# Patient Record
Sex: Female | Born: 1950
Health system: Southern US, Community
[De-identification: ages and names within clinical notes are randomized; demographics above are authoritative.]

## PROBLEM LIST (undated history)

## (undated) DIAGNOSIS — J45909 Unspecified asthma, uncomplicated: Secondary | ICD-10-CM

## (undated) DIAGNOSIS — I700 Atherosclerosis of aorta: Secondary | ICD-10-CM

## (undated) DIAGNOSIS — E785 Hyperlipidemia, unspecified: Secondary | ICD-10-CM

## (undated) DIAGNOSIS — E039 Hypothyroidism, unspecified: Secondary | ICD-10-CM

## (undated) DIAGNOSIS — R55 Syncope and collapse: Secondary | ICD-10-CM

## (undated) DIAGNOSIS — D649 Anemia, unspecified: Secondary | ICD-10-CM

## (undated) DIAGNOSIS — I639 Cerebral infarction, unspecified: Secondary | ICD-10-CM

## (undated) DIAGNOSIS — M810 Age-related osteoporosis without current pathological fracture: Secondary | ICD-10-CM

## (undated) HISTORY — DX: Anemia, unspecified: D64.9

## (undated) HISTORY — PX: CARDIAC CATHETERIZATION: SHX172

## (undated) HISTORY — DX: Atherosclerosis of aorta: I70.0

## (undated) HISTORY — DX: Hypothyroidism, unspecified: E03.9

## (undated) HISTORY — PX: CHOLECYSTECTOMY: SHX55

## (undated) HISTORY — DX: Unspecified asthma, uncomplicated: J45.909

## (undated) HISTORY — DX: Hyperlipidemia, unspecified: E78.5

## (undated) HISTORY — DX: Age-related osteoporosis without current pathological fracture: M81.0

## (undated) HISTORY — DX: Syncope and collapse: R55

## (undated) HISTORY — DX: Cerebral infarction, unspecified: I63.9

---

## 2000-04-10 HISTORY — PX: TENDON RELEASE: SHX230

## 2000-04-10 HISTORY — PX: OTHER SURGICAL HISTORY: SHX169

## 2006-04-10 HISTORY — PX: ELBOW SURGERY: SHX618

## 2007-04-11 DIAGNOSIS — K8020 Calculus of gallbladder without cholecystitis without obstruction: Secondary | ICD-10-CM

## 2007-04-11 HISTORY — DX: Calculus of gallbladder without cholecystitis without obstruction: K80.20

## 2012-12-25 HISTORY — PX: COLONOSCOPY: SHX174

## 2012-12-25 LAB — HM COLONOSCOPY

## 2015-06-09 DIAGNOSIS — G2581 Restless legs syndrome: Secondary | ICD-10-CM | POA: Insufficient documentation

## 2015-07-05 DIAGNOSIS — I10 Essential (primary) hypertension: Secondary | ICD-10-CM | POA: Insufficient documentation

## 2015-07-05 HISTORY — DX: Essential (primary) hypertension: I10

## 2015-07-06 DIAGNOSIS — E538 Deficiency of other specified B group vitamins: Secondary | ICD-10-CM | POA: Insufficient documentation

## 2015-07-06 HISTORY — DX: Deficiency of other specified B group vitamins: E53.8

## 2015-07-08 DIAGNOSIS — I639 Cerebral infarction, unspecified: Secondary | ICD-10-CM | POA: Insufficient documentation

## 2015-07-08 HISTORY — DX: Cerebral infarction, unspecified: I63.9

## 2017-09-04 DIAGNOSIS — E538 Deficiency of other specified B group vitamins: Secondary | ICD-10-CM | POA: Diagnosis not present

## 2017-10-04 DIAGNOSIS — E538 Deficiency of other specified B group vitamins: Secondary | ICD-10-CM | POA: Diagnosis not present

## 2017-10-31 DIAGNOSIS — E538 Deficiency of other specified B group vitamins: Secondary | ICD-10-CM | POA: Diagnosis not present

## 2017-12-07 DIAGNOSIS — E538 Deficiency of other specified B group vitamins: Secondary | ICD-10-CM | POA: Diagnosis not present

## 2017-12-24 DIAGNOSIS — E782 Mixed hyperlipidemia: Secondary | ICD-10-CM | POA: Diagnosis not present

## 2017-12-24 DIAGNOSIS — E210 Primary hyperparathyroidism: Secondary | ICD-10-CM | POA: Diagnosis not present

## 2017-12-24 DIAGNOSIS — E038 Other specified hypothyroidism: Secondary | ICD-10-CM | POA: Diagnosis not present

## 2017-12-24 DIAGNOSIS — K210 Gastro-esophageal reflux disease with esophagitis: Secondary | ICD-10-CM | POA: Diagnosis not present

## 2017-12-24 DIAGNOSIS — I10 Essential (primary) hypertension: Secondary | ICD-10-CM | POA: Diagnosis not present

## 2017-12-24 DIAGNOSIS — I69322 Dysarthria following cerebral infarction: Secondary | ICD-10-CM | POA: Diagnosis not present

## 2017-12-24 DIAGNOSIS — E538 Deficiency of other specified B group vitamins: Secondary | ICD-10-CM | POA: Diagnosis not present

## 2017-12-24 DIAGNOSIS — I201 Angina pectoris with documented spasm: Secondary | ICD-10-CM | POA: Diagnosis not present

## 2017-12-24 DIAGNOSIS — Z23 Encounter for immunization: Secondary | ICD-10-CM | POA: Diagnosis not present

## 2017-12-24 DIAGNOSIS — Z681 Body mass index (BMI) 19 or less, adult: Secondary | ICD-10-CM | POA: Diagnosis not present

## 2018-01-07 DIAGNOSIS — E538 Deficiency of other specified B group vitamins: Secondary | ICD-10-CM | POA: Diagnosis not present

## 2018-01-08 DIAGNOSIS — H16223 Keratoconjunctivitis sicca, not specified as Sjogren's, bilateral: Secondary | ICD-10-CM | POA: Diagnosis not present

## 2018-01-08 DIAGNOSIS — H2513 Age-related nuclear cataract, bilateral: Secondary | ICD-10-CM | POA: Diagnosis not present

## 2018-01-15 DIAGNOSIS — E538 Deficiency of other specified B group vitamins: Secondary | ICD-10-CM | POA: Diagnosis not present

## 2018-01-15 DIAGNOSIS — Z23 Encounter for immunization: Secondary | ICD-10-CM | POA: Diagnosis not present

## 2018-01-15 DIAGNOSIS — Z0000 Encounter for general adult medical examination without abnormal findings: Secondary | ICD-10-CM | POA: Diagnosis not present

## 2018-01-22 DIAGNOSIS — H2513 Age-related nuclear cataract, bilateral: Secondary | ICD-10-CM | POA: Diagnosis not present

## 2018-01-22 DIAGNOSIS — H16223 Keratoconjunctivitis sicca, not specified as Sjogren's, bilateral: Secondary | ICD-10-CM | POA: Diagnosis not present

## 2018-01-30 DIAGNOSIS — N959 Unspecified menopausal and perimenopausal disorder: Secondary | ICD-10-CM | POA: Diagnosis not present

## 2018-01-30 DIAGNOSIS — M818 Other osteoporosis without current pathological fracture: Secondary | ICD-10-CM | POA: Diagnosis not present

## 2018-01-30 LAB — HM DEXA SCAN

## 2018-02-04 DIAGNOSIS — E538 Deficiency of other specified B group vitamins: Secondary | ICD-10-CM | POA: Diagnosis not present

## 2018-02-22 DIAGNOSIS — H18453 Nodular corneal degeneration, bilateral: Secondary | ICD-10-CM | POA: Diagnosis not present

## 2018-03-11 DIAGNOSIS — E538 Deficiency of other specified B group vitamins: Secondary | ICD-10-CM | POA: Diagnosis not present

## 2018-03-20 DIAGNOSIS — H18453 Nodular corneal degeneration, bilateral: Secondary | ICD-10-CM | POA: Diagnosis not present

## 2018-03-29 DIAGNOSIS — H1852 Epithelial (juvenile) corneal dystrophy: Secondary | ICD-10-CM | POA: Diagnosis not present

## 2018-04-11 DIAGNOSIS — Z124 Encounter for screening for malignant neoplasm of cervix: Secondary | ICD-10-CM | POA: Diagnosis not present

## 2018-04-11 DIAGNOSIS — Z1231 Encounter for screening mammogram for malignant neoplasm of breast: Secondary | ICD-10-CM | POA: Diagnosis not present

## 2018-04-11 DIAGNOSIS — Z01419 Encounter for gynecological examination (general) (routine) without abnormal findings: Secondary | ICD-10-CM | POA: Diagnosis not present

## 2018-04-16 DIAGNOSIS — E538 Deficiency of other specified B group vitamins: Secondary | ICD-10-CM | POA: Diagnosis not present

## 2018-05-02 DIAGNOSIS — Z1231 Encounter for screening mammogram for malignant neoplasm of breast: Secondary | ICD-10-CM | POA: Diagnosis not present

## 2018-05-08 DIAGNOSIS — H1852 Epithelial (juvenile) corneal dystrophy: Secondary | ICD-10-CM | POA: Diagnosis not present

## 2018-05-14 DIAGNOSIS — E538 Deficiency of other specified B group vitamins: Secondary | ICD-10-CM | POA: Diagnosis not present

## 2018-05-16 DIAGNOSIS — H1852 Epithelial (juvenile) corneal dystrophy: Secondary | ICD-10-CM | POA: Diagnosis not present

## 2018-06-13 DIAGNOSIS — E538 Deficiency of other specified B group vitamins: Secondary | ICD-10-CM | POA: Diagnosis not present

## 2018-06-25 DIAGNOSIS — E038 Other specified hypothyroidism: Secondary | ICD-10-CM | POA: Diagnosis not present

## 2018-06-25 DIAGNOSIS — E538 Deficiency of other specified B group vitamins: Secondary | ICD-10-CM | POA: Diagnosis not present

## 2018-06-25 DIAGNOSIS — M810 Age-related osteoporosis without current pathological fracture: Secondary | ICD-10-CM | POA: Diagnosis not present

## 2018-06-25 DIAGNOSIS — I10 Essential (primary) hypertension: Secondary | ICD-10-CM | POA: Diagnosis not present

## 2018-06-25 DIAGNOSIS — Z681 Body mass index (BMI) 19 or less, adult: Secondary | ICD-10-CM | POA: Diagnosis not present

## 2018-06-25 DIAGNOSIS — E440 Moderate protein-calorie malnutrition: Secondary | ICD-10-CM | POA: Diagnosis not present

## 2018-06-25 DIAGNOSIS — E782 Mixed hyperlipidemia: Secondary | ICD-10-CM | POA: Diagnosis not present

## 2018-06-27 DIAGNOSIS — H2513 Age-related nuclear cataract, bilateral: Secondary | ICD-10-CM | POA: Diagnosis not present

## 2018-08-13 DIAGNOSIS — E538 Deficiency of other specified B group vitamins: Secondary | ICD-10-CM | POA: Diagnosis not present

## 2018-09-16 DIAGNOSIS — E538 Deficiency of other specified B group vitamins: Secondary | ICD-10-CM | POA: Diagnosis not present

## 2018-09-23 DIAGNOSIS — H52202 Unspecified astigmatism, left eye: Secondary | ICD-10-CM | POA: Diagnosis not present

## 2018-09-23 DIAGNOSIS — H52203 Unspecified astigmatism, bilateral: Secondary | ICD-10-CM | POA: Diagnosis not present

## 2018-09-23 DIAGNOSIS — H2512 Age-related nuclear cataract, left eye: Secondary | ICD-10-CM | POA: Diagnosis not present

## 2018-09-23 DIAGNOSIS — H2513 Age-related nuclear cataract, bilateral: Secondary | ICD-10-CM | POA: Diagnosis not present

## 2018-09-23 DIAGNOSIS — Z01818 Encounter for other preprocedural examination: Secondary | ICD-10-CM | POA: Diagnosis not present

## 2018-09-23 DIAGNOSIS — H25812 Combined forms of age-related cataract, left eye: Secondary | ICD-10-CM | POA: Diagnosis not present

## 2018-10-08 DIAGNOSIS — H25811 Combined forms of age-related cataract, right eye: Secondary | ICD-10-CM | POA: Diagnosis not present

## 2018-10-08 DIAGNOSIS — H52201 Unspecified astigmatism, right eye: Secondary | ICD-10-CM | POA: Diagnosis not present

## 2018-10-08 DIAGNOSIS — H2511 Age-related nuclear cataract, right eye: Secondary | ICD-10-CM | POA: Diagnosis not present

## 2018-10-15 DIAGNOSIS — E538 Deficiency of other specified B group vitamins: Secondary | ICD-10-CM | POA: Diagnosis not present

## 2018-11-12 DIAGNOSIS — E538 Deficiency of other specified B group vitamins: Secondary | ICD-10-CM | POA: Diagnosis not present

## 2018-12-12 DIAGNOSIS — E538 Deficiency of other specified B group vitamins: Secondary | ICD-10-CM | POA: Diagnosis not present

## 2018-12-26 DIAGNOSIS — E782 Mixed hyperlipidemia: Secondary | ICD-10-CM | POA: Diagnosis not present

## 2018-12-26 DIAGNOSIS — I10 Essential (primary) hypertension: Secondary | ICD-10-CM | POA: Diagnosis not present

## 2018-12-26 DIAGNOSIS — Z23 Encounter for immunization: Secondary | ICD-10-CM | POA: Diagnosis not present

## 2018-12-26 DIAGNOSIS — K210 Gastro-esophageal reflux disease with esophagitis: Secondary | ICD-10-CM | POA: Diagnosis not present

## 2018-12-26 DIAGNOSIS — M810 Age-related osteoporosis without current pathological fracture: Secondary | ICD-10-CM | POA: Diagnosis not present

## 2018-12-26 DIAGNOSIS — Z1159 Encounter for screening for other viral diseases: Secondary | ICD-10-CM | POA: Diagnosis not present

## 2018-12-26 DIAGNOSIS — E038 Other specified hypothyroidism: Secondary | ICD-10-CM | POA: Diagnosis not present

## 2018-12-26 DIAGNOSIS — Z681 Body mass index (BMI) 19 or less, adult: Secondary | ICD-10-CM | POA: Diagnosis not present

## 2018-12-26 DIAGNOSIS — E210 Primary hyperparathyroidism: Secondary | ICD-10-CM | POA: Diagnosis not present

## 2018-12-26 DIAGNOSIS — I249 Acute ischemic heart disease, unspecified: Secondary | ICD-10-CM | POA: Diagnosis not present

## 2018-12-26 DIAGNOSIS — E440 Moderate protein-calorie malnutrition: Secondary | ICD-10-CM | POA: Diagnosis not present

## 2019-01-14 DIAGNOSIS — E538 Deficiency of other specified B group vitamins: Secondary | ICD-10-CM | POA: Diagnosis not present

## 2019-02-07 DIAGNOSIS — Z1159 Encounter for screening for other viral diseases: Secondary | ICD-10-CM | POA: Diagnosis not present

## 2019-02-07 DIAGNOSIS — R509 Fever, unspecified: Secondary | ICD-10-CM | POA: Diagnosis not present

## 2019-02-07 DIAGNOSIS — R05 Cough: Secondary | ICD-10-CM | POA: Diagnosis not present

## 2019-02-07 DIAGNOSIS — N3000 Acute cystitis without hematuria: Secondary | ICD-10-CM | POA: Diagnosis not present

## 2019-02-18 DIAGNOSIS — E538 Deficiency of other specified B group vitamins: Secondary | ICD-10-CM | POA: Diagnosis not present

## 2019-03-09 ENCOUNTER — Encounter: Payer: Self-pay | Admitting: Cardiology

## 2019-03-09 NOTE — Progress Notes (Signed)
Cardiology Office Note:    Date:  03/11/2019   ID:  Barbara Duncan, DOB [birth year], MRN [id number]  PCP:  Lillard Anes, MD  Cardiologist:  Shirlee More, MD   Referring MD: Lillard Anes,*  ASSESSMENT:    1. SOB (shortness of breath)   2. Dyslipidemia   3. Aortic atherosclerosis ([place])   4. History of stroke    PLAN:    In order of problems listed above:  1. She is a non-smoker is developed a pattern of progressive shortness of breath over the last few years.  Her previous echocardiogram suggests the presence of mitral and aortic regurgitation but no murmur on examination.  For further evaluation I have asked her to have a proBNP level performed and a repeat echocardiogram to define if she has significant valvular heart disease I be quite surprised on the basis of physical exam with no findings of heart failure.  For completeness she will do CBC performed and continue her current thyroid supplement.  TSH performed 12/26/2018 was normal.  If the studies are unremarkable would benefit from further evaluation PFT and high-resolution CT of the chest.  At this time I do not think she needs a repeat ischemia evaluation with normal coronary angiography in 2016 2. Stable continue her statin lipids are ideal 3. Continue statin aspirin but reduce to 81 mg daily 4. No recurrence continue low-dose aspirin  Next appointment 6 weeks   Medication Adjustments/Labs and Tests Ordered: Current medicines are reviewed at length with the patient today.  Concerns regarding medicines are outlined above.  No orders of the defined types were placed in this encounter.  No orders of the defined types were placed in this encounter.    Chief Complaint  Patient presents with  . Shortness of Breath    History of Present Illness:    Barbara Duncan is a 68 y.o. female who is being seen today for the evaluation of SOB at the request of Lillard Anes,*. She has a history of right middle  cerebral artery occipital stroke and left occipital stroke in the setting of estrogen usage.  There is a notation she wore a Zio patch with second-degree AV block in her neurology note in January 2018.  She was seen by me 11/24/2015 at [facility] cardiology at which time my office notes say that her ambulatory heart rhythm monitor showed no indication of atrial fibrillation and was not advised pacemaker or anticoagulation.  Her other medical problems include aortic atherosclerosis hypothyroidism and osteoporosis.  An echocardiogram performed 2017 showed normal left ventricular size and function EF 55 to 60% with inferior and apical hypokinesia.  Other findings included mild aortic regurgitation moderate mitral regurgitation mild tricuspid regurgitation and mild elevation of pulmonary artery pressure.  Contrast echo showed no evidence of shunt. There is a notation that she was diagnosed with acute ischemic heart disease 12 months ago with no records available in Epic or Care Everywhere to support the diagnosis.  When she has now has a pattern of shortness of breath when she works in the garden or climbs stairs especially carrying laundry.  No edema orthopnea chest pain no cough or wheezing she is a non-smoker and no history of lung disease.  She thinks she has had a chest x-ray performed in the last year.  Because of the slow progressive nature of the shortness of breath she became concerned and seeks attention.  She has some palpitation but is brief infrequent and never sustained.  She been  maintained on aspirin has had no recurrent neurologic event. Past Medical History:  Diagnosis Date  . Acquired hypothyroidism   . Anemia   . Aortic atherosclerosis ([facility])   . Asthma   . Dyslipidemia   . Osteoporosis   . Stroke (cerebrum) ([facility])   . Syncope     Past Surgical History:  Procedure Laterality Date  . CARDIAC CATHETERIZATION    . CHOLECYSTECTOMY      Current Medications: Current Meds  Medication Sig  .  alendronate (FOSAMAX) 70 MG tablet Take 70 mg by mouth once a week. Take with a full glass of water on an empty stomach.  Marland Kitchen. aspirin 325 MG tablet Take 325 mg by mouth daily.  . cyanocobalamin (,VITAMIN B-12,) 1000 MCG/ML injection Inject 1,000 mcg into the skin every 30 (thirty) days.  Marland Kitchen. levothyroxine (SYNTHROID) 25 MCG tablet Take 25 mcg by mouth daily.  . pantoprazole (PROTONIX) 40 MG tablet Take 40 mg by mouth daily.  . pravastatin (PRAVACHOL) 20 MG tablet Take 20 mg by mouth daily.  . verapamil (VERELAN PM) 180 MG 24 hr capsule Take 180 mg by mouth daily.     Allergies:   Patient has no known allergies.   Social History   Socioeconomic History  . Marital status: Married    Spouse name: Not on file  . Number of children: Not on file  . Years of education: Not on file  . Highest education level: Not on file  Occupational History  . Not on file  Social Needs  . Financial resource strain: Not on file  . Food insecurity    Worry: Not on file    Inability: Not on file  . Transportation needs    Medical: Not on file    Non-medical: Not on file  Tobacco Use  . Smoking status: Never Smoker  . Smokeless tobacco: Never Used  Substance and Sexual Activity  . Alcohol use: Never    Frequency: Never  . Drug use: Never  . Sexual activity: Not on file  Lifestyle  . Physical activity    Days per week: Not on file    Minutes per session: Not on file  . Stress: Not on file  Relationships  . Social Musicianconnections    Talks on phone: Not on file    Gets together: Not on file    Attends religious service: Not on file    Active member of club or organization: Not on file    Attends meetings of clubs or organizations: Not on file    Relationship status: Not on file  Other Topics Concern  . Not on file  Social History Narrative  . Not on file     Family History: The patient's family history includes Hyperlipidemia in her father and mother; Hypertension in her father and mother.  ROS:    Review of Systems  Constitution: Negative.  HENT: Negative.   Eyes: Negative.   Cardiovascular: Positive for dyspnea on exertion and palpitations.  Respiratory: Negative for shortness of breath.   Endocrine: Negative.   Hematologic/Lymphatic: Negative.   Skin: Negative.   Musculoskeletal: Negative.   Gastrointestinal: Negative.   Genitourinary: Negative.   Neurological: Negative.   Psychiatric/Behavioral: Negative.   Allergic/Immunologic: Negative.    Please see the history of present illness.      All other systems reviewed and are negative.  EKGs/Labs/Other Studies Reviewed:    The following studies were reviewed today: Labs available from PCP 12/26/2018 shows a cholesterol 163 HDL  79 LDL 68 creatinine 1.22  EKG:  EKG is  ordered today.  The ekg ordered today is personally reviewed and demonstrates sinus rhythm and normal   Physical Exam:    VS:  BP 106/64 (BP Location: Right Arm, Patient Position: Sitting, Cuff Size: Normal)   Pulse 81   Ht 5' (1.524 m)   Wt 98 lb 3.2 oz (44.5 kg)   SpO2 94%   BMI 19.18 kg/m     Wt Readings from Last 3 Encounters:  03/11/19 98 lb 3.2 oz (44.5 kg)     GEN: Very petite small body stature well nourished, well developed in no acute distress HEENT: Normal NECK: No JVD; No carotid bruits LYMPHATICS: No lymphadenopathy CARDIAC: There is no heart murmur RRR, no murmurs, rubs, gallops RESPIRATORY:  Clear to auscultation without rales, wheezing or rhonchi  ABDOMEN: Soft, non-tender, non-distended MUSCULOSKELETAL:  No edema; No deformity  SKIN: Warm and dry NEUROLOGIC:  Alert and oriented x 3 PSYCHIATRIC:  Normal affect     Signed, Norman Herrlich, MD  03/11/2019 10:53 AM    [facility]

## 2019-03-11 ENCOUNTER — Encounter: Payer: Self-pay | Admitting: Cardiology

## 2019-03-11 ENCOUNTER — Other Ambulatory Visit: Payer: Self-pay

## 2019-03-11 ENCOUNTER — Ambulatory Visit (INDEPENDENT_AMBULATORY_CARE_PROVIDER_SITE_OTHER): Payer: Medicare Other | Admitting: Cardiology

## 2019-03-11 VITALS — BP 106/64 | HR 81 | Ht 60.0 in | Wt 98.2 lb

## 2019-03-11 DIAGNOSIS — I700 Atherosclerosis of aorta: Secondary | ICD-10-CM

## 2019-03-11 DIAGNOSIS — E785 Hyperlipidemia, unspecified: Secondary | ICD-10-CM

## 2019-03-11 DIAGNOSIS — R0602 Shortness of breath: Secondary | ICD-10-CM

## 2019-03-11 DIAGNOSIS — Z8673 Personal history of transient ischemic attack (TIA), and cerebral infarction without residual deficits: Secondary | ICD-10-CM | POA: Diagnosis not present

## 2019-03-11 MED ORDER — ASPIRIN EC 81 MG PO TBEC: 81.0000 mg | DELAYED_RELEASE_TABLET | Freq: Every day | ORAL | 3 refills | Status: AC

## 2019-03-11 NOTE — Patient Instructions (Signed)
Medication Instructions:  Your physician has recommended you make the following change in your medication:   DECREASE aspirin 81 mg: Take 1 tablet daily  *If you need a refill on your cardiac medications before your next appointment, please call your pharmacy*  Lab Work: Your physician recommends that you return for lab work today: CBC, BMP, ProBNP.   If you have labs (blood work) drawn today and your tests are completely normal, you will receive your results only by: Marland Kitchen MyChart Message (if you have MyChart) OR . A paper copy in the mail If you have any lab test that is abnormal or we need to change your treatment, we will call you to review the results.  Testing/Procedures: You had an EKG today.   Your physician has requested that you have an echocardiogram. Echocardiography is a painless test that uses sound waves to create images of your heart. It provides your doctor with information about the size and shape of your heart and how well your heart's chambers and valves are working. This procedure takes approximately one hour. There are no restrictions for this procedure.  Follow-Up: At [facility], you and your health needs are our priority.  As part of our continuing mission to provide you with exceptional heart care, we have created designated Provider Care Teams.  These Care Teams include your primary Cardiologist (physician) and Advanced Practice Providers (APPs -  Physician Assistants and Nurse Practitioners) who all work together to provide you with the care you need, when you need it.  Your next appointment:   6 week(s)  The format for your next appointment:   In Person  Provider:   Norman Herrlich, MD     Echocardiogram An echocardiogram is a procedure that uses painless sound waves (ultrasound) to produce an image of the heart. Images from an echocardiogram can provide important information about:  Signs of coronary artery disease (CAD).  Aneurysm detection. An  aneurysm is a weak or damaged part of an artery wall that bulges out from the normal force of blood pumping through the body.  Heart size and shape. Changes in the size or shape of the heart can be associated with certain conditions, including heart failure, aneurysm, and CAD.  Heart muscle function.  Heart valve function.  Signs of a past heart attack.  Fluid buildup around the heart.  Thickening of the heart muscle.  A tumor or infectious growth around the heart valves. Tell a health care provider about:  Any allergies you have.  All medicines you are taking, including vitamins, herbs, eye drops, creams, and over-the-counter medicines.  Any blood disorders you have.  Any surgeries you have had.  Any medical conditions you have.  Whether you are pregnant or may be pregnant. What are the risks? Generally, this is a safe procedure. However, problems may occur, including:  Allergic reaction to dye (contrast) that may be used during the procedure. What happens before the procedure? No specific preparation is needed. You may eat and drink normally. What happens during the procedure?   An IV tube may be inserted into one of your veins.  You may receive contrast through this tube. A contrast is an injection that improves the quality of the pictures from your heart.  A gel will be applied to your chest.  A wand-like tool (transducer) will be moved over your chest. The gel will help to transmit the sound waves from the transducer.  The sound waves will harmlessly bounce off of your heart  to allow the heart images to be captured in real-time motion. The images will be recorded on a computer. The procedure may vary among health care providers and hospitals. What happens after the procedure?  You may return to your normal, everyday life, including diet, activities, and medicines, unless your health care provider tells you not to do that. Summary  An echocardiogram is a  procedure that uses painless sound waves (ultrasound) to produce an image of the heart.  Images from an echocardiogram can provide important information about the size and shape of your heart, heart muscle function, heart valve function, and fluid buildup around your heart.  You do not need to do anything to prepare before this procedure. You may eat and drink normally.  After the echocardiogram is completed, you may return to your normal, everyday life, unless your health care provider tells you not to do that. This information is not intended to replace advice given to you by your health care provider. Make sure you discuss any questions you have with your health care provider. Document Released: 03/24/2000 Document Revised: 07/18/2018 Document Reviewed: 04/29/2016 Elsevier Patient Education  2020 [employer].

## 2019-03-11 NOTE — Addendum Note (Signed)
Addended by: Austin Miles on: 03/11/2019 11:00 AM   Modules accepted: Orders

## 2019-03-12 LAB — BASIC METABOLIC PANEL
BUN/Creatinine Ratio: 7 — ABNORMAL LOW (ref 12–28)
BUN: 7 mg/dL — ABNORMAL LOW (ref 8–27)
CO2: 21 mmol/L (ref 20–29)
Calcium: 10 mg/dL (ref 8.7–10.3)
Chloride: 105 mmol/L (ref 96–106)
Creatinine, Ser: 1.02 mg/dL — ABNORMAL HIGH (ref 0.57–1.00)
GFR calc Af Amer: 66 mL/min/{1.73_m2} (ref >59)
GFR calc non Af Amer: 57 mL/min/{1.73_m2} — ABNORMAL LOW (ref >59)
Glucose: 86 mg/dL (ref 65–99)
Potassium: 5.1 mmol/L (ref 3.5–5.2)
Sodium: 142 mmol/L (ref 134–144)

## 2019-03-12 LAB — CBC
Hematocrit: 37.2 % (ref 34.0–46.6)
Hemoglobin: 12.3 g/dL (ref 11.1–15.9)
MCH: 29.5 pg (ref 26.6–33.0)
MCHC: 33.1 g/dL (ref 31.5–35.7)
MCV: 89 fL (ref 79–97)
Platelets: 302 10*3/uL (ref 150–450)
RBC: 4.17 x10E6/uL (ref 3.77–5.28)
RDW: 13.1 % (ref 11.7–15.4)
WBC: 8.5 10*3/uL (ref 3.4–10.8)

## 2019-03-12 LAB — PRO B NATRIURETIC PEPTIDE: NT-Pro BNP: 303 pg/mL — ABNORMAL HIGH (ref 0–301)

## 2019-03-13 ENCOUNTER — Telehealth: Payer: Self-pay | Admitting: Cardiology

## 2019-03-13 NOTE — Telephone Encounter (Signed)
Patient called and informed of lab results by Harrel Lemon, CMA.

## 2019-03-13 NOTE — Telephone Encounter (Signed)
Returning your call. °

## 2019-03-21 DIAGNOSIS — E538 Deficiency of other specified B group vitamins: Secondary | ICD-10-CM | POA: Diagnosis not present

## 2019-05-04 NOTE — Progress Notes (Signed)
Cardiology Office Note:    Date:  05/05/2019   ID:  Barbara Duncan, DOB [birth year], MRN [id number]  PCP:  Lillard Anes, MD  Cardiologist:  Shirlee More, MD    Referring MD: Lillard Anes,*    ASSESSMENT:    1. SOB (shortness of breath)   2. Dyslipidemia   3. Acquired hypothyroidism    PLAN:    In order of problems listed above:  1. Continues to have exertional shortness of breath no findings of pulmonary hypertension or heart failure will switch evaluate pulmonary causes including interstitial lung disease with full PFTs diffusion capacity bronchodilator noncontrast CT of chest.  If she has coronary calcification and the studies are normal may benefit from a cardiac CTA but she had normal coronary angiogram in 2016. 2. Continue her low intensity statin 3. And your thyroid supplement   Next appointment: 6 weeks   Medication Adjustments/Labs and Tests Ordered: Current medicines are reviewed at length with the patient today.  Concerns regarding medicines are outlined above.  No orders of the defined types were placed in this encounter.  No orders of the defined types were placed in this encounter.   Chief Complaint  Patient presents with  . Shortness of Breath    History of Present Illness:    Barbara Duncan is a 69 y.o. female with a hx of SOB last seen 03/11/2019.  Compliance with diet, lifestyle and medications: Yes  She has a history of right middle cerebral artery occipital stroke and left occipital stroke in the setting of estrogen usage.  There is a notation she wore a Zio patch with second-degree AV block in her neurology note in January 2018.  She was seen by me 11/24/2015 at [facility] cardiology at which time my office notes say that her ambulatory heart rhythm monitor showed no indication of atrial fibrillation and was not advised pacemaker or anticoagulation.  Her other medical problems include aortic atherosclerosis hypothyroidism and osteoporosis.   An echocardiogram performed 2017 showed normal left ventricular size and function EF 55 to 60% with inferior and apical hypokinesia.  Other findings included mild aortic regurgitation moderate mitral regurgitation mild tricuspid regurgitation and mild elevation of pulmonary artery pressure.  Contrast echo showed no evidence of shunt.    Ref Range & Units 1 mo ago  NT-Pro BNP 0 - 301 pg/mL 303High    I quickly reviewed her echocardiogram done today she has trace aortic mild mitral regurgitation and has a normal ejection fraction and filling pressures.  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  If these are normal we may consider an ischemia evaluation but I am hesitant to do it as she had normal coronary arteriography in 2016.  She continues to be short of breath when she tries to do housework and is forced to stop and rest for relief.  She has no cough or wheezing edema orthopnea.  BNP level was not consistent with heart failure and there is no findings of pulmonary artery hypertension or heart failure on echo Past Medical History:  Diagnosis Date  . Acquired hypothyroidism   . Anemia   . Aortic atherosclerosis ([place])   . Asthma   . Dyslipidemia   . Osteoporosis   . Stroke (cerebrum) ([place])   . Syncope  Past Surgical History:  Procedure Laterality Date  . CARDIAC CATHETERIZATION    . CHOLECYSTECTOMY      Current Medications: Current Meds  Medication Sig  . alendronate (FOSAMAX) 70 MG tablet Take 70 mg by mouth once a week. Take with a full glass of water on an empty stomach.  Marland Kitchen aspirin EC 81 MG tablet Take 1 tablet (81 mg total) by mouth daily.  . cyanocobalamin (,VITAMIN B-12,) 1000 MCG/ML injection Inject 1,000 mcg into the skin every 30 (thirty) days.  Marland Kitchen levothyroxine  (SYNTHROID) 25 MCG tablet Take 25 mcg by mouth daily.  . pantoprazole (PROTONIX) 40 MG tablet Take 40 mg by mouth daily.  . pravastatin (PRAVACHOL) 20 MG tablet Take 20 mg by mouth daily.  . verapamil (VERELAN PM) 180 MG 24 hr capsule Take 180 mg by mouth daily.     Allergies:   Patient has no known allergies.   Social History   Socioeconomic History  . Marital status: Married    Spouse name: Not on file  . Number of children: Not on file  . Years of education: Not on file  . Highest education level: Not on file  Occupational History  . Not on file  Tobacco Use  . Smoking status: Never Smoker  . Smokeless tobacco: Never Used  Substance and Sexual Activity  . Alcohol use: Never  . Drug use: Never  . Sexual activity: Not on file  Other Topics Concern  . Not on file  Social History Narrative  . Not on file   Social Determinants of Health   Financial Resource Strain:   . Difficulty of Paying Living Expenses: Not on file  Food Insecurity:   . Worried About Programme researcher, broadcasting/film/video in the Last Year: Not on file  . Ran Out of Food in the Last Year: Not on file  Transportation Needs:   . Lack of Transportation (Medical): Not on file  . Lack of Transportation (Non-Medical): Not on file  Physical Activity:   . Days of Exercise per Week: Not on file  . Minutes of Exercise per Session: Not on file  Stress:   . Feeling of Stress : Not on file  Social Connections:   . Frequency of Communication with Friends and Family: Not on file  . Frequency of Social Gatherings with Friends and Family: Not on file  . Attends Religious Services: Not on file  . Active Member of Clubs or Organizations: Not on file  . Attends Banker Meetings: Not on file  . Marital Status: Not on file     Family History: The patient's family history includes Hyperlipidemia in her father and mother; Hypertension in her father and mother. ROS:   Please see the history of present illness.    All other  systems reviewed and are negative.  EKGs/Labs/Other Studies Reviewed:    The following studies were reviewed today:    Recent Labs: 03/11/2019: BUN 7; Creatinine, Ser 1.02; Hemoglobin 12.3; NT-Pro BNP 303; Platelets 302; Potassium 5.1; Sodium 142  Recent Lipid Panel No results found for: CHOL, TRIG, HDL, CHOLHDL, VLDL, LDLCALC, LDLDIRECT  Physical Exam:    VS:  BP 128/78   Pulse 80   Ht 5' (1.524 m)   Wt 98 lb (44.5 kg)   SpO2 98%   BMI 19.14 kg/m     Wt Readings from Last 3 Encounters:  05/05/19 98 lb (44.5 kg)  03/11/19 98 lb 3.2 oz (44.5 kg)     GEN:  Well nourished, well developed in no acute distress HEENT: Normal NECK: No JVD; No carotid bruits LYMPHATICS: No lymphadenopathy CARDIAC: RRR, no murmurs, rubs, gallops RESPIRATORY:  Clear to auscultation without rales, wheezing or rhonchi  ABDOMEN: Soft, non-tender, non-distended MUSCULOSKELETAL:  No edema; No deformity  SKIN: Warm and dry NEUROLOGIC:  Alert and oriented x 3 PSYCHIATRIC:  Normal affect    Signed, Norman Herrlich, MD  05/05/2019 3:57 PM    [facility]

## 2019-05-05 ENCOUNTER — Ambulatory Visit (INDEPENDENT_AMBULATORY_CARE_PROVIDER_SITE_OTHER): Payer: Medicare Other

## 2019-05-05 ENCOUNTER — Encounter: Payer: Self-pay | Admitting: Cardiology

## 2019-05-05 ENCOUNTER — Other Ambulatory Visit: Payer: Self-pay

## 2019-05-05 ENCOUNTER — Ambulatory Visit (INDEPENDENT_AMBULATORY_CARE_PROVIDER_SITE_OTHER): Payer: Medicare Other | Admitting: Cardiology

## 2019-05-05 VITALS — BP 128/78 | HR 80 | Ht 60.0 in | Wt 98.0 lb

## 2019-05-05 DIAGNOSIS — E039 Hypothyroidism, unspecified: Secondary | ICD-10-CM | POA: Diagnosis not present

## 2019-05-05 DIAGNOSIS — R0602 Shortness of breath: Secondary | ICD-10-CM

## 2019-05-05 DIAGNOSIS — E785 Hyperlipidemia, unspecified: Secondary | ICD-10-CM

## 2019-05-05 NOTE — Patient Instructions (Addendum)
Medication Instructions:  Your physician recommends that you continue on your current medications as directed. Please refer to the Current Medication list given to you today.  *If you need a refill on your cardiac medications before your next appointment, please call your pharmacy*  Lab Work: Future: BMP  If you have labs (blood work) drawn today and your tests are completely normal, you will receive your results only by: Marland Kitchen MyChart Message (if you have MyChart) OR . A paper copy in the mail If you have any lab test that is abnormal or we need to change your treatment, we will call you to review the results.  Testing/Procedures: Your physician has recommended that you have a pulmonary function test. Pulmonary Function Tests are a group of tests that measure how well air moves in and out of your lungs at the Med center in [facility]   Your physician has ordered for you to have a chest ct in Med Center   Follow-Up: At [facility], you and your health needs are our priority.  As part of our continuing mission to provide you with exceptional heart care, we have created designated Provider Care Teams.  These Care Teams include your primary Cardiologist (physician) and Advanced Practice Providers (APPs -  Physician Assistants and Nurse Practitioners) who all work together to provide you with the care you need, when you need it.  Your next appointment:   6 week(s)  The format for your next appointment:   In Person  Provider:   Norman Herrlich, MD  Other Instructions None

## 2019-05-08 DIAGNOSIS — R0602 Shortness of breath: Secondary | ICD-10-CM | POA: Diagnosis not present

## 2019-05-09 ENCOUNTER — Telehealth: Payer: Self-pay

## 2019-05-09 LAB — BASIC METABOLIC PANEL
BUN/Creatinine Ratio: 12 (ref 12–28)
BUN: 13 mg/dL (ref 8–27)
CO2: 24 mmol/L (ref 20–29)
Calcium: 10.2 mg/dL (ref 8.7–10.3)
Chloride: 102 mmol/L (ref 96–106)
Creatinine, Ser: 1.12 mg/dL — ABNORMAL HIGH (ref 0.57–1.00)
GFR calc Af Amer: 59 mL/min/{1.73_m2} — ABNORMAL LOW (ref >59)
GFR calc non Af Amer: 51 mL/min/{1.73_m2} — ABNORMAL LOW (ref >59)
Glucose: 95 mg/dL (ref 65–99)
Potassium: 4.8 mmol/L (ref 3.5–5.2)
Sodium: 139 mmol/L (ref 134–144)

## 2019-05-09 NOTE — Telephone Encounter (Signed)
The patient has been notified of the lab result and verbalized understanding.  All questions (if any) were answered. Sigurd Sos, RN 05/09/2019 8:52 AM

## 2019-05-09 NOTE — Telephone Encounter (Signed)
-----   Message from Baldo Daub, MD sent at 05/09/2019  7:52 AM EST ----- Normal or stable result  No changes

## 2019-05-13 ENCOUNTER — Ambulatory Visit (HOSPITAL_BASED_OUTPATIENT_CLINIC_OR_DEPARTMENT_OTHER)
Admission: RE | Admit: 2019-05-13 | Discharge: 2019-05-13 | Disposition: A | Discharge status: Home / Self Care | Payer: Medicare Other | Source: Ambulatory Visit | Attending: Cardiology | Admitting: Cardiology

## 2019-05-13 ENCOUNTER — Other Ambulatory Visit: Payer: Self-pay

## 2019-05-13 DIAGNOSIS — R0602 Shortness of breath: Secondary | ICD-10-CM | POA: Insufficient documentation

## 2019-05-20 ENCOUNTER — Ambulatory Visit (INDEPENDENT_AMBULATORY_CARE_PROVIDER_SITE_OTHER): Payer: Medicare Other

## 2019-05-20 ENCOUNTER — Other Ambulatory Visit: Payer: Self-pay

## 2019-05-20 DIAGNOSIS — E538 Deficiency of other specified B group vitamins: Secondary | ICD-10-CM

## 2019-05-20 MED ORDER — CYANOCOBALAMIN 1000 MCG/ML IJ SOLN
1000.0000 ug | Freq: Once | INTRAMUSCULAR | Status: AC
  Administered 2019-05-20: 11:00:00 1000 ug via INTRAMUSCULAR

## 2019-05-20 NOTE — Progress Notes (Signed)
B-12 injection was given in the left deltoid intramuscularly.  Patient tolerated well.

## 2019-06-17 ENCOUNTER — Other Ambulatory Visit: Payer: Self-pay

## 2019-06-17 ENCOUNTER — Ambulatory Visit (INDEPENDENT_AMBULATORY_CARE_PROVIDER_SITE_OTHER): Payer: Medicare Other

## 2019-06-17 DIAGNOSIS — E538 Deficiency of other specified B group vitamins: Secondary | ICD-10-CM

## 2019-06-17 HISTORY — DX: Deficiency of other specified B group vitamins: E53.8

## 2019-06-17 MED ORDER — CYANOCOBALAMIN 1000 MCG/ML IJ SOLN
1000.0000 ug | Freq: Once | INTRAMUSCULAR | Status: AC
  Administered 2019-06-17: 1000 ug via INTRAMUSCULAR

## 2019-06-17 NOTE — Progress Notes (Signed)
B12 injection given per order, patient tolerated well. 

## 2019-06-25 ENCOUNTER — Other Ambulatory Visit: Payer: Self-pay

## 2019-06-25 ENCOUNTER — Other Ambulatory Visit: Payer: Self-pay | Admitting: Legal Medicine

## 2019-06-25 ENCOUNTER — Encounter: Payer: Self-pay | Admitting: Legal Medicine

## 2019-06-25 ENCOUNTER — Ambulatory Visit (INDEPENDENT_AMBULATORY_CARE_PROVIDER_SITE_OTHER): Payer: Medicare Other | Admitting: Legal Medicine

## 2019-06-25 VITALS — BP 110/64 | HR 71 | Temp 97.1°F | Ht 61.0 in | Wt 98.0 lb

## 2019-06-25 DIAGNOSIS — E782 Mixed hyperlipidemia: Secondary | ICD-10-CM | POA: Diagnosis not present

## 2019-06-25 DIAGNOSIS — Z1231 Encounter for screening mammogram for malignant neoplasm of breast: Secondary | ICD-10-CM

## 2019-06-25 DIAGNOSIS — E039 Hypothyroidism, unspecified: Secondary | ICD-10-CM

## 2019-06-25 DIAGNOSIS — E538 Deficiency of other specified B group vitamins: Secondary | ICD-10-CM

## 2019-06-25 DIAGNOSIS — Z0000 Encounter for general adult medical examination without abnormal findings: Secondary | ICD-10-CM

## 2019-06-25 DIAGNOSIS — E038 Other specified hypothyroidism: Secondary | ICD-10-CM | POA: Diagnosis not present

## 2019-06-25 DIAGNOSIS — I10 Essential (primary) hypertension: Secondary | ICD-10-CM | POA: Diagnosis not present

## 2019-06-25 HISTORY — DX: Hypothyroidism, unspecified: E03.9

## 2019-06-25 HISTORY — DX: Mixed hyperlipidemia: E78.2

## 2019-06-25 NOTE — Assessment & Plan Note (Signed)

## 2019-06-25 NOTE — Assessment & Plan Note (Signed)

## 2019-06-25 NOTE — Progress Notes (Signed)
Cardiology Office Note:    Date:  06/26/2019   ID:  Barbara Duncan, DOB November 28, 1950, MRN [id number]  PCP:  Abigail Miyamoto, MD  Cardiologist:  Norman Herrlich, MD    Referring MD: Abigail Miyamoto,*    ASSESSMENT:    1. Dyslipidemia   2. Essential hypertension   3. Aortic atherosclerosis (HCC)   4. History of stroke    PLAN:    In order of problems listed above:  1. Stable continue lipid-lowering therapy goal LDL less than 70.  Her last lipid profile 12/26/2018 had an LDL of 68 cholesterol 169 HDL 79 2. Controlled continue current treatment calcium channel blocker 3. Continue statin 4. Continue aspirin statin   Next appointment: As needed   Medication Adjustments/Labs and Tests Ordered: Current medicines are reviewed at length with the patient today.  Concerns regarding medicines are outlined above.  No orders of the defined types were placed in this encounter.  No orders of the defined types were placed in this encounter.   Chief Complaint  Patient presents with  . Follow-up    After echocardiogram    History of Present Illness:    Barbara Duncan is a 69 y.o. female with a hx of right middle cerebral artery occipital stroke and left occipital stroke in the setting of estrogen usage.  There is a notation she wore a Zio patch with second-degree AV block in her neurology note in January 2018.  She was seen by me 11/24/2015 at [facility] cardiology at which time my office notes say that her ambulatory heart rhythm monitor showed no indication of atrial fibrillation and was not advised pacemaker or anticoagulation.  Her other medical problems include aortic atherosclerosis hypothyroidism and osteoporosis.  An echocardiogram performed 2017 showed normal left ventricular size and function EF 55 to 60% with inferior and apical hypokinesia.  Other findings included mild aortic regurgitation moderate mitral regurgitation mild tricuspid regurgitation and mild elevation of pulmonary  artery pressure.  Contrast echo showed no evidence of shunt.   She was last seen 05/05/2019. Compliance with diet, lifestyle and medications: Yes  Reviewed her CT of the chest unremarkable echocardiogram for purposes normal.  Her shortness of breath is resolved.  We discussed further testing parents decided at this time to do any further testing or symptoms recurrently bothersome and she is comfortable with this approach.  Her hypertension is well controlled continue current treatment and she is on a statin with aortic atherosclerosis and history of stroke had labs drawn yesterday pending at her PCP office.  CT chest 05/13/2019: IMPRESSION: 1. A cause for the patient's shortness of breath is not identified. 2. Small type 1 hiatal hernia. 3. Atrophic upper margin of the left kidney compatible with patient's known severe left renal atrophy. 4. Prominence of stool in the upper abdominal colon, query constipation. 5.  Aortic Atherosclerosis   Echo 05/05/2019: 1. Left ventricular ejection fraction, by visual estimation, is 55 to  60%. The left ventricle has normal function. Left ventricular septal wall  thickness was normal. Normal left ventricular posterior wall thickness.  There is no left ventricular  hypertrophy.  2. Left ventricular diastolic parameters are consistent with Grade I  diastolic dysfunction (impaired relaxation).  3. The left ventricle has no regional wall motion abnormalities.  4. Global right ventricle has normal systolic function.The right  ventricular size is normal. No increase in right ventricular wall  thickness.  5. Left atrial size was normal.  6. Right atrial size was normal.  7.  The mitral valve is normal in structure. Mild mitral valve  regurgitation. No evidence of mitral stenosis.  8. The tricuspid valve is normal in structure.  9. The tricuspid valve is normal in structure. Tricuspid valve  regurgitation is trivial.  10. The aortic valve is  tricuspid. Aortic valve regurgitation is trivial.  No evidence of aortic valve sclerosis or stenosis.  11. The pulmonic valve was normal in structure. Pulmonic valve  regurgitation is not visualized.  12. Normal pulmonary artery systolic pressure.  13. The inferior vena cava is normal in size with greater than 50%  respiratory variability, suggesting right atrial pressure of 3 mmHg Past Medical History:  Diagnosis Date  . Acquired hypothyroidism   . Anemia   . Aortic atherosclerosis (HCC)   . Asthma   . Dyslipidemia   . Essential hypertension 07/05/2015  . Hypothyroidism 06/25/2019  . Mixed hyperlipidemia 06/25/2019  . Osteoporosis   . Stroke (cerebrum) (HCC)   . Syncope   . Vitamin B12 deficiency (non anemic) 06/17/2019    Past Surgical History:  Procedure Laterality Date  . CARDIAC CATHETERIZATION    . CHOLECYSTECTOMY      Current Medications: Current Meds  Medication Sig  . alendronate (FOSAMAX) 70 MG tablet Take 70 mg by mouth once a week. Take with a full glass of water on an empty stomach.  Marland Kitchen aspirin EC 81 MG tablet Take 1 tablet (81 mg total) by mouth daily.  . cyanocobalamin (,VITAMIN B-12,) 1000 MCG/ML injection Inject 1,000 mcg into the skin every 30 (thirty) days.  Marland Kitchen levothyroxine (SYNTHROID) 25 MCG tablet Take 25 mcg by mouth daily.  . pantoprazole (PROTONIX) 40 MG tablet Take 40 mg by mouth daily.  . pravastatin (PRAVACHOL) 20 MG tablet Take 20 mg by mouth daily.  . verapamil (VERELAN PM) 180 MG 24 hr capsule Take 180 mg by mouth daily.     Allergies:   Patient has no known allergies.   Social History   Socioeconomic History  . Marital status: Married    Spouse name: Not on file  . Number of children: Not on file  . Years of education: Not on file  . Highest education level: Not on file  Occupational History  . Not on file  Tobacco Use  . Smoking status: Never Smoker  . Smokeless tobacco: Never Used  Substance and Sexual Activity  . Alcohol use: Never   . Drug use: Never  . Sexual activity: Not on file  Other Topics Concern  . Not on file  Social History Narrative  . Not on file   Social Determinants of Health   Financial Resource Strain:   . Difficulty of Paying Living Expenses:   Food Insecurity:   . Worried About Programme researcher, broadcasting/film/video in the Last Year:   . Barista in the Last Year:   Transportation Needs:   . Freight forwarder (Medical):   Marland Kitchen Lack of Transportation (Non-Medical):   Physical Activity:   . Days of Exercise per Week:   . Minutes of Exercise per Session:   Stress:   . Feeling of Stress :   Social Connections:   . Frequency of Communication with Friends and Family:   . Frequency of Social Gatherings with Friends and Family:   . Attends Religious Services:   . Active Member of Clubs or Organizations:   . Attends Banker Meetings:   Marland Kitchen Marital Status:      Family History: The patient's family  history includes Hyperlipidemia in her father and mother; Hypertension in her father and mother. ROS:   Please see the history of present illness.    All other systems reviewed and are negative.  EKGs/Labs/Other Studies Reviewed:    The following studies were reviewed today:  Recent Labs: 03/11/2019: Hemoglobin 12.3; NT-Pro BNP 303; Platelets 302 05/08/2019: BUN 13; Creatinine, Ser 1.12; Potassium 4.8; Sodium 139  Recent Lipid Panel No results found for: CHOL, TRIG, HDL, CHOLHDL, VLDL, LDLCALC, LDLDIRECT  Physical Exam:    VS:  BP 112/64   Pulse 73   Temp 98 F (36.7 C)   Ht 5\' 1"  (1.549 m)   Wt 97 lb 9.6 oz (44.3 kg)   SpO2 97%   BMI 18.44 kg/m     Wt Readings from Last 3 Encounters:  06/26/19 97 lb 9.6 oz (44.3 kg)  06/25/19 98 lb (44.5 kg)  05/05/19 98 lb (44.5 kg)     GEN:  Well nourished, well developed in no acute distress HEENT: Normal NECK: No JVD; No carotid bruits LYMPHATICS: No lymphadenopathy CARDIAC: RRR, no murmurs, rubs, gallops RESPIRATORY:  Clear to  auscultation without rales, wheezing or rhonchi  ABDOMEN: Soft, non-tender, non-distended MUSCULOSKELETAL:  No edema; No deformity  SKIN: Warm and dry NEUROLOGIC:  Alert and oriented x 3 PSYCHIATRIC:  Normal affect    Signed, Shirlee More, MD  06/26/2019 2:58 PM    [facility]

## 2019-06-25 NOTE — Assessment & Plan Note (Signed)
AN INDIVIDUAL CARE PLAN was established and reinforced today.  The patient's status was assessed using clinical findings on exam, labs, and other diagnostic testing. Patient's success at meeting treatment goals based on disease specific evidence-bassed guidelines and found to be in good control. RECOMMENDATIONS include maintaining present medicines and treatment. 

## 2019-06-25 NOTE — Assessment & Plan Note (Signed)
Patient has hypothyroidism and is on replacement.  No symptoms.

## 2019-06-25 NOTE — Progress Notes (Signed)
Established Patient Office Visit  Subjective:  Patient ID: Barbara Duncan, female    DOB: [birth year]  Age: 69 y.o. MRN: [id number]  CC:  Chief Complaint  Patient presents with  . Hypertension  . Hyperlipidemia  . Hypothyroidism    HPI Barbara Duncan presents for chronic visit  Patient presents for follow up of hypertension.  Patient tolerating verapamil well with side effects.  Patient was diagnosed with hypertension 2010 so has been treated for hypertension for 10 years.Patient is working on maintaining diet and exercise regimen and follows up as directed. Complication include none.  Patient presents with hyperlipidemia.  Compliance with treatment has been good; patient takes medicines as directed, maintains low cholesterol diet, follows up as directed, and maintains exercise regimen.  Patient is using pravastatin without problems.  Patient has HYPOTHYROIDISM.  Diagnosed 10 years ago.  Patient has stable thyroid readings.  Patient is having no symptoms.  Last TSH was 3.15.  continue dosage of thyroid medicine.  Past Medical History:  Diagnosis Date  . Acquired hypothyroidism   . Anemia   . Aortic atherosclerosis ([place])   . Asthma   . Dyslipidemia   . Osteoporosis   . Stroke (cerebrum) ([place])   . Syncope     Past Surgical History:  Procedure Laterality Date  . CARDIAC CATHETERIZATION    . CHOLECYSTECTOMY      Family History  Problem Relation Age of Onset  . Hypertension Mother   . Hyperlipidemia Mother   . Hypertension Father   . Hyperlipidemia Father     Social History   Socioeconomic History  . Marital status: Married    Spouse name: Not on file  . Number of children: Not on file  . Years of education: Not on file  . Highest education level: Not on file  Occupational History  . Not on file  Tobacco Use  . Smoking status: Never Smoker  . Smokeless tobacco: Never Used  Substance and Sexual Activity  . Alcohol use: Never  . Drug use: Never  . Sexual  activity: Not on file  Other Topics Concern  . Not on file  Social History Narrative  . Not on file   Social Determinants of Health   Financial Resource Strain:   . Difficulty of Paying Living Expenses:   Food Insecurity:   . Worried About Charity fundraiser in the Last Year:   . Arboriculturist in the Last Year:   Transportation Needs:   . Film/video editor (Medical):   Marland Kitchen Lack of Transportation (Non-Medical):   Physical Activity:   . Days of Exercise per Week:   . Minutes of Exercise per Session:   Stress:   . Feeling of Stress :   Social Connections:   . Frequency of Communication with Friends and Family:   . Frequency of Social Gatherings with Friends and Family:   . Attends Religious Services:   . Active Member of Clubs or Organizations:   . Attends Archivist Meetings:   Marland Kitchen Marital Status:   Intimate Partner Violence:   . Fear of Current or Ex-Partner:   . Emotionally Abused:   Marland Kitchen Physically Abused:   . Sexually Abused:     Outpatient Medications Prior to Visit  Medication Sig Dispense Refill  . alendronate (FOSAMAX) 70 MG tablet Take 70 mg by mouth once a week. Take with a full glass of water on an empty stomach.    Marland Kitchen aspirin EC 81 MG tablet Take  1 tablet (81 mg total) by mouth daily. 90 tablet 3  . cyanocobalamin (,VITAMIN B-12,) 1000 MCG/ML injection Inject 1,000 mcg into the skin every 30 (thirty) days.    Marland Kitchen levothyroxine (SYNTHROID) 25 MCG tablet Take 25 mcg by mouth daily.    . pantoprazole (PROTONIX) 40 MG tablet Take 40 mg by mouth daily.    . pravastatin (PRAVACHOL) 20 MG tablet Take 20 mg by mouth daily.    . verapamil (VERELAN PM) 180 MG 24 hr capsule Take 180 mg by mouth daily.     No facility-administered medications prior to visit.    No Known Allergies  ROS Review of Systems  Constitutional: Negative.   HENT: Negative.   Eyes: Negative.   Respiratory: Positive for shortness of breath.   Cardiovascular: Negative.   Endocrine:  Negative.   Genitourinary: Negative.   Neurological: Negative.   Hematological: Negative.   Psychiatric/Behavioral: Negative.       Objective:    Physical Exam  Constitutional: She is oriented to person, place, and time. She appears well-developed and well-nourished.  HENT:  Head: Normocephalic and atraumatic.  Eyes: Pupils are equal, round, and reactive to light. Conjunctivae and EOM are normal.  Cardiovascular: Normal rate and normal heart sounds.  Pulmonary/Chest: Effort normal and breath sounds normal.  Abdominal: Soft. Bowel sounds are normal.  Musculoskeletal:        General: Normal range of motion.     Cervical back: Normal range of motion and neck supple.  Neurological: She is alert and oriented to person, place, and time. She has normal reflexes.  Skin: Skin is warm and dry.  Psychiatric: She has a normal mood and affect.  Vitals reviewed.   BP 110/64   Pulse 71   Temp (!) 97.1 F (36.2 C)   Ht 5\' 1"  (1.549 m)   Wt 98 lb (44.5 kg)   SpO2 98%   BMI 18.52 kg/m  Wt Readings from Last 3 Encounters:  06/25/19 98 lb (44.5 kg)  05/05/19 98 lb (44.5 kg)  03/11/19 98 lb 3.2 oz (44.5 kg)     Health Maintenance Due  Topic Date Due  . Hepatitis C Screening  Never done  . TETANUS/TDAP  Never done  . MAMMOGRAM  Never done  . COLONOSCOPY  Never done  . DEXA SCAN  Never done  . PNA vac Low Risk Adult (1 of 2 - PCV13) Never done  . INFLUENZA VACCINE  Never done    There are no preventive care reminders to display for this patient.  No results found for: TSH Lab Results  Component Value Date   WBC 8.5 03/11/2019   HGB 12.3 03/11/2019   HCT 37.2 03/11/2019   MCV 89 03/11/2019   PLT 302 03/11/2019   Lab Results  Component Value Date   NA 139 05/08/2019   K 4.8 05/08/2019   CO2 24 05/08/2019   GLUCOSE 95 05/08/2019   BUN 13 05/08/2019   CREATININE 1.12 (H) 05/08/2019   CALCIUM 10.2 05/08/2019   No results found for: CHOL No results found for: HDL No  results found for: LDLCALC No results found for: TRIG No results found for: CHOLHDL No results found for: 05/10/2019    Assessment & Plan:   Problem List Items Addressed This Visit      Cardiovascular and Mediastinum   Essential hypertension - Primary    An individual care plan was established and reinforced today.  The patient's status was assessed using clinical findings on  exam and labs or diagnostic tests. The patient's success at meeting treatment goals on disease specific evidence-based guidelines and found to be well controlled. SELF MANAGEMENT: The patient and I together assessed ways to personally work towards obtaining the recommended goals. RECOMMENDATIONS: avoid decongestants found in common cold remedies, decrease consumption of alcohol, perform routine monitoring of BP with home BP cuff, exercise, reduction of dietary salt, take medicines as prescribed, try not to miss doses and quit smoking.  Regular exercise and maintaining a healthy weight is needed.  Stress reduction may help. A CLINICAL SUMMARY including written plan identify barriers to care unique to individual due to social or financial issues.  We attempt to mutually creat solutions for individual and family understanding.      Relevant Orders   CBC with Differential   Comprehensive metabolic panel     Endocrine   Hypothyroidism    Patient has hypothyroidism and is on replacement.  No symptoms.      Relevant Orders   TSH     Other   Vitamin B12 deficiency (non anemic)    AN INDIVIDUAL CARE PLAN was established and reinforced today.  The patient's status was assessed using clinical findings on exam, labs, and other diagnostic testing. Patient's success at meeting treatment goals based on disease specific evidence-bassed guidelines and found to be in good control. RECOMMENDATIONS include maintaining present medicines and treatment.      Relevant Orders   Vitamin B12   Mixed hyperlipidemia    AN INDIVIDUAL CARE  PLAN was established and reinforced today.  The patient's status was assessed using clinical findings on exam, lab and other diagnostic tests. The patient's disease status was assessed based on evidence-based guidelines and found to be well controlled. MEDICATIONS were reviewed. SELF MANAGEMENT GOALS have been discussed and patient's success at attaining the goal of low cholesterol was assessed. RECOMMENDATION given include regular exercise 3 days a week and low cholesterol/low fat diet. CLINICAL SUMMARY including written plan to identify barriers unique to the patient due to social or economic  reasons was discussed.       Other Visit Diagnoses    Wellness examination       Relevant Orders   MM Digital Screening   Encounter for screening mammogram for malignant neoplasm of breast       Relevant Orders   MM Digital Screening      No orders of the defined types were placed in this encounter.   Follow-up: Return in about 4 months (around 10/25/2019) for fasting.    Brent Bulla, MD

## 2019-06-26 ENCOUNTER — Ambulatory Visit (INDEPENDENT_AMBULATORY_CARE_PROVIDER_SITE_OTHER): Payer: Medicare Other | Admitting: Cardiology

## 2019-06-26 VITALS — BP 112/64 | HR 73 | Temp 98.0°F | Ht 61.0 in | Wt 97.6 lb

## 2019-06-26 DIAGNOSIS — Z8673 Personal history of transient ischemic attack (TIA), and cerebral infarction without residual deficits: Secondary | ICD-10-CM

## 2019-06-26 DIAGNOSIS — I700 Atherosclerosis of aorta: Secondary | ICD-10-CM | POA: Diagnosis not present

## 2019-06-26 DIAGNOSIS — E785 Hyperlipidemia, unspecified: Secondary | ICD-10-CM | POA: Diagnosis not present

## 2019-06-26 DIAGNOSIS — I10 Essential (primary) hypertension: Secondary | ICD-10-CM

## 2019-06-26 LAB — CBC WITH DIFFERENTIAL/PLATELET
Basophils Absolute: 0.1 10*3/uL (ref 0.0–0.2)
Basos: 2 %
EOS (ABSOLUTE): 0.1 10*3/uL (ref 0.0–0.4)
Eos: 1 %
Hematocrit: 40.3 % (ref 34.0–46.6)
Hemoglobin: 13.6 g/dL (ref 11.1–15.9)
Immature Grans (Abs): 0 10*3/uL (ref 0.0–0.1)
Immature Granulocytes: 0 %
Lymphocytes Absolute: 2.1 10*3/uL (ref 0.7–3.1)
Lymphs: 28 %
MCH: 30 pg (ref 26.6–33.0)
MCHC: 33.7 g/dL (ref 31.5–35.7)
MCV: 89 fL (ref 79–97)
Monocytes Absolute: 0.7 10*3/uL (ref 0.1–0.9)
Monocytes: 9 %
Neutrophils Absolute: 4.5 10*3/uL (ref 1.4–7.0)
Neutrophils: 60 %
Platelets: 330 10*3/uL (ref 150–450)
RBC: 4.54 x10E6/uL (ref 3.77–5.28)
RDW: 13.8 % (ref 11.7–15.4)
WBC: 7.5 10*3/uL (ref 3.4–10.8)

## 2019-06-26 LAB — COMPREHENSIVE METABOLIC PANEL
ALT: 12 IU/L (ref 0–32)
AST: 21 IU/L (ref 0–40)
Albumin/Globulin Ratio: 2.1 (ref 1.2–2.2)
Albumin: 5 g/dL — ABNORMAL HIGH (ref 3.8–4.8)
Alkaline Phosphatase: 79 IU/L (ref 39–117)
BUN/Creatinine Ratio: 10 — ABNORMAL LOW (ref 12–28)
BUN: 12 mg/dL (ref 8–27)
Bilirubin Total: 0.4 mg/dL (ref 0.0–1.2)
CO2: 23 mmol/L (ref 20–29)
Calcium: 10.4 mg/dL — ABNORMAL HIGH (ref 8.7–10.3)
Chloride: 102 mmol/L (ref 96–106)
Creatinine, Ser: 1.19 mg/dL — ABNORMAL HIGH (ref 0.57–1.00)
GFR calc Af Amer: 55 mL/min/{1.73_m2} — ABNORMAL LOW (ref >59)
GFR calc non Af Amer: 48 mL/min/{1.73_m2} — ABNORMAL LOW (ref >59)
Globulin, Total: 2.4 g/dL (ref 1.5–4.5)
Glucose: 91 mg/dL (ref 65–99)
Potassium: 5.2 mmol/L (ref 3.5–5.2)
Sodium: 139 mmol/L (ref 134–144)
Total Protein: 7.4 g/dL (ref 6.0–8.5)

## 2019-06-26 LAB — B12 AND FOLATE PANEL
Folate: 6.7 ng/mL (ref >3.0)
Vitamin B-12: 904 pg/mL (ref 232–1245)

## 2019-06-26 LAB — LIPID PANEL W/O CHOL/HDL RATIO
Cholesterol, Total: 168 mg/dL (ref 100–199)
HDL: 69 mg/dL (ref >39)
LDL Chol Calc (NIH): 81 mg/dL (ref 0–99)
Triglycerides: 103 mg/dL (ref 0–149)
VLDL Cholesterol Cal: 18 mg/dL (ref 5–40)

## 2019-06-26 LAB — TSH: TSH: 2.18 u[IU]/mL (ref 0.450–4.500)

## 2019-06-26 LAB — CARDIOVASCULAR RISK ASSESSMENT

## 2019-06-26 NOTE — Patient Instructions (Addendum)
Medication Instructions:  Your physician recommends that you continue on your current medications as directed. Please refer to the Current Medication list given to you today.  *If you need a refill on your cardiac medications before your next appointment, please call your pharmacy*   Lab Work: None ordered  If you have labs (blood work) drawn today and your tests are completely normal, you will receive your results only by: Marland Kitchen MyChart Message (if you have MyChart) OR . A paper copy in the mail If you have any lab test that is abnormal or we need to change your treatment, we will call you to review the results.   Testing/Procedures: None ordered   Follow-Up: At [facility], you and your health needs are our priority.  As part of our continuing mission to provide you with exceptional heart care, we have created designated Provider Care Teams.  These Care Teams include your primary Cardiologist (physician) and Advanced Practice Providers (APPs -  Physician Assistants and Nurse Practitioners) who all work together to provide you with the care you need, when you need it.  We recommend signing up for the patient portal called "MyChart".  Sign up information is provided on this After Visit Summary.  MyChart is used to connect with patients for Virtual Visits (Telemedicine).  Patients are able to view lab/test results, encounter notes, upcoming appointments, etc.  Non-urgent messages can be sent to your provider as well.   To learn more about what you can do with MyChart, go to [email or web address].    Your next appointment:   AS NEEDED  The format for your next appointment:   In Person  Provider:   Norman Herrlich, MD   Other Instructions

## 2019-07-03 NOTE — Progress Notes (Signed)
CBC normal, kidney tests stable, calcium 10.4 stop added calcium, liver tests normal, Cholesterol normal, B12 and folate normal level. TSH 2.18 normal lp

## 2019-07-15 ENCOUNTER — Ambulatory Visit (INDEPENDENT_AMBULATORY_CARE_PROVIDER_SITE_OTHER): Payer: Medicare Other

## 2019-07-15 ENCOUNTER — Other Ambulatory Visit: Payer: Self-pay

## 2019-07-15 DIAGNOSIS — E538 Deficiency of other specified B group vitamins: Secondary | ICD-10-CM

## 2019-07-15 MED ORDER — CYANOCOBALAMIN 1000 MCG/ML IJ SOLN
1000.0000 ug | Freq: Once | INTRAMUSCULAR | Status: AC
  Administered 2019-07-15: 10:00:00 1000 ug via INTRAMUSCULAR

## 2019-07-15 NOTE — Progress Notes (Signed)
Patient came in this morning for her B-12 Injection.  It was given in the left deltoid and she tolerated it well.

## 2019-07-17 DIAGNOSIS — Z1231 Encounter for screening mammogram for malignant neoplasm of breast: Secondary | ICD-10-CM | POA: Diagnosis not present

## 2019-07-22 ENCOUNTER — Encounter: Payer: Self-pay | Admitting: Legal Medicine

## 2019-08-14 ENCOUNTER — Ambulatory Visit (INDEPENDENT_AMBULATORY_CARE_PROVIDER_SITE_OTHER): Payer: Medicare Other

## 2019-08-14 ENCOUNTER — Other Ambulatory Visit: Payer: Self-pay

## 2019-08-14 ENCOUNTER — Ambulatory Visit: Payer: Medicare Other

## 2019-08-14 DIAGNOSIS — E538 Deficiency of other specified B group vitamins: Secondary | ICD-10-CM | POA: Diagnosis not present

## 2019-08-14 MED ORDER — CYANOCOBALAMIN 1000 MCG/ML IJ SOLN
1000.0000 ug | Freq: Once | INTRAMUSCULAR | Status: AC
  Administered 2019-08-14: 1000 ug via INTRAMUSCULAR

## 2019-08-14 NOTE — Progress Notes (Signed)
     B12 injection given per order, patient tolerated well.   Jacklynn Bue, LPN 2:49 PM

## 2019-09-15 ENCOUNTER — Other Ambulatory Visit: Payer: Self-pay

## 2019-09-15 ENCOUNTER — Ambulatory Visit (INDEPENDENT_AMBULATORY_CARE_PROVIDER_SITE_OTHER): Payer: Medicare Other

## 2019-09-15 DIAGNOSIS — E538 Deficiency of other specified B group vitamins: Secondary | ICD-10-CM | POA: Diagnosis not present

## 2019-09-15 MED ORDER — CYANOCOBALAMIN 1000 MCG/ML IJ SOLN
1000.0000 ug | Freq: Once | INTRAMUSCULAR | Status: AC
  Administered 2019-09-15: 1000 ug via INTRAMUSCULAR

## 2019-09-15 NOTE — Progress Notes (Signed)
° ° °  B12 injection given per order, patient tolerated well.   Administrations This Visit    cyanocobalamin ((VITAMIN B-12)) injection 1,000 mcg    Admin Date 09/15/2019 Action Given Dose 1,000 mcg Route Intramuscular Administered By Jacklynn Bue, LPN          Jacklynn Bue, LPN 4:65 PM

## 2019-10-06 ENCOUNTER — Other Ambulatory Visit: Payer: Self-pay | Admitting: Legal Medicine

## 2019-10-15 ENCOUNTER — Other Ambulatory Visit: Payer: Self-pay

## 2019-10-15 ENCOUNTER — Ambulatory Visit (INDEPENDENT_AMBULATORY_CARE_PROVIDER_SITE_OTHER): Payer: Medicare Other

## 2019-10-15 DIAGNOSIS — E538 Deficiency of other specified B group vitamins: Secondary | ICD-10-CM | POA: Diagnosis not present

## 2019-10-15 MED ORDER — CYANOCOBALAMIN 1000 MCG/ML IJ SOLN
1000.0000 ug | INTRAMUSCULAR | Status: DC
  Administered 2019-10-15 – 2020-05-03 (×5): 1000 ug via INTRAMUSCULAR

## 2019-10-15 NOTE — Progress Notes (Signed)
° ° °  °  B12 injection given per order, patient tolerated well.  Administrations This Visit    cyanocobalamin ((VITAMIN B-12)) injection 1,000 mcg    Admin Date 10/15/2019 Action Given Dose 1,000 mcg Route Intramuscular Administered By Jacklynn Bue, LPN          Creola Corn, LPN 32/91/91 6:60 PM

## 2019-10-27 ENCOUNTER — Ambulatory Visit (INDEPENDENT_AMBULATORY_CARE_PROVIDER_SITE_OTHER): Payer: Medicare Other | Admitting: Legal Medicine

## 2019-10-27 ENCOUNTER — Other Ambulatory Visit: Payer: Self-pay

## 2019-10-27 ENCOUNTER — Encounter: Payer: Self-pay | Admitting: Legal Medicine

## 2019-10-27 VITALS — BP 104/70 | HR 57 | Temp 97.0°F | Resp 16 | Ht 61.0 in | Wt 99.0 lb

## 2019-10-27 DIAGNOSIS — E038 Other specified hypothyroidism: Secondary | ICD-10-CM

## 2019-10-27 DIAGNOSIS — E538 Deficiency of other specified B group vitamins: Secondary | ICD-10-CM | POA: Diagnosis not present

## 2019-10-27 DIAGNOSIS — E782 Mixed hyperlipidemia: Secondary | ICD-10-CM

## 2019-10-27 DIAGNOSIS — I10 Essential (primary) hypertension: Secondary | ICD-10-CM | POA: Diagnosis not present

## 2019-10-27 NOTE — Progress Notes (Signed)
Subjective:  Patient ID: Barbara Duncan, female    DOB: June 16, 1950  Age: 69 y.o. MRN: [id number]  Chief Complaint  Patient presents with  . Hypertension  . Hyperlipidemia  . Hypothyroidism    HPI: chronic visit  Patient presents for follow up of hypertension.  Patient tolerating verapamil well with side effects.  Patient was diagnosed with hypertension 2010 so has been treated for hypertension for 10 years.Patient is working on maintaining diet and exercise regimen and follows up as directed. Complication include none.  Patient presents with hyperlipidemia.  Compliance with treatment has been good; patient takes medicines as directed, maintains low cholesterol diet, follows up as directed, and maintains exercise regimen.  Patient is using pravastatin without problems.  Patient has HYPOTHYROIDISM.  Diagnosed 10 years ago.  Patient has stable thyroid readings.  Patient is having no symptoms.  Last TSH was stable.  continue dosage of thyroid medicine.   Current Outpatient Medications on File Prior to Visit  Medication Sig Dispense Refill  . alendronate (FOSAMAX) 70 MG tablet Take 70 mg by mouth once a week. Take with a full glass of water on an empty stomach.    Marland Kitchen aspirin EC 81 MG tablet Take 1 tablet (81 mg total) by mouth daily. 90 tablet 3  . cyanocobalamin (,VITAMIN B-12,) 1000 MCG/ML injection Inject 1,000 mcg into the skin every 30 (thirty) days.    Marland Kitchen levothyroxine (SYNTHROID) 25 MCG tablet Take 25 mcg by mouth daily.    . pantoprazole (PROTONIX) 40 MG tablet Take 40 mg by mouth daily.    . pravastatin (PRAVACHOL) 20 MG tablet TAKE ONE TABLET BY MOUTH ONCE DAILY 90 tablet 2  . verapamil (CALAN-SR) 180 MG CR tablet TAKE ONE TABLET BY MOUTH EVERY MORNING 90 tablet 2  . verapamil (VERELAN PM) 180 MG 24 hr capsule Take 180 mg by mouth daily.     Current Facility-Administered Medications on File Prior to Visit  Medication Dose Route Frequency Provider Last Rate Last Admin  .  cyanocobalamin ((VITAMIN B-12)) injection 1,000 mcg  1,000 mcg Intramuscular Q30 days Abigail Miyamoto, MD   1,000 mcg at 10/15/19 1353   Past Medical History:  Diagnosis Date  . Acquired hypothyroidism   . Anemia   . Aortic atherosclerosis (HCC)   . Asthma   . Dyslipidemia   . Essential hypertension 07/05/2015  . Hypothyroidism 06/25/2019  . Mixed hyperlipidemia 06/25/2019  . Osteoporosis   . Stroke (cerebrum) (HCC)   . Syncope   . Vitamin B12 deficiency (non anemic) 06/17/2019   Past Surgical History:  Procedure Laterality Date  . CARDIAC CATHETERIZATION    . CHOLECYSTECTOMY      Family History  Problem Relation Age of Onset  . Hypertension Mother   . Hyperlipidemia Mother   . Hypertension Father   . Hyperlipidemia Father    Social History   Socioeconomic History  . Marital status: Married    Spouse name: Not on file  . Number of children: Not on file  . Years of education: Not on file  . Highest education level: Not on file  Occupational History  . Not on file  Tobacco Use  . Smoking status: Never Smoker  . Smokeless tobacco: Never Used  Vaping Use  . Vaping Use: Never used  Substance and Sexual Activity  . Alcohol use: Never  . Drug use: Never  . Sexual activity: Not on file  Other Topics Concern  . Not on file  Social History Narrative  .  Not on file   Social Determinants of Health   Financial Resource Strain:   . Difficulty of Paying Living Expenses:   Food Insecurity:   . Worried About Programme researcher, broadcasting/film/video in the Last Year:   . Barista in the Last Year:   Transportation Needs:   . Freight forwarder (Medical):   Marland Kitchen Lack of Transportation (Non-Medical):   Physical Activity:   . Days of Exercise per Week:   . Minutes of Exercise per Session:   Stress:   . Feeling of Stress :   Social Connections:   . Frequency of Communication with Friends and Family:   . Frequency of Social Gatherings with Friends and Family:   . Attends  Religious Services:   . Active Member of Clubs or Organizations:   . Attends Banker Meetings:   Marland Kitchen Marital Status:     Review of Systems  Constitutional: Negative.   HENT: Negative.   Eyes: Negative.   Respiratory: Negative.   Cardiovascular: Negative.   Gastrointestinal: Negative.   Endocrine: Negative.   Genitourinary: Negative.   Musculoskeletal: Negative.   Skin: Negative.   Neurological: Negative.   Psychiatric/Behavioral: Negative.      Objective:  BP 104/70   Pulse (!) 57   Temp (!) 97 F (36.1 C)   Resp 16   Ht 5\' 1"  (1.549 m)   Wt 99 lb (44.9 kg)   SpO2 99%   BMI 18.71 kg/m   BP/Weight 10/27/2019 06/26/2019 06/25/2019  Systolic BP 104 112 110  Diastolic BP 70 64 64  Wt. (Lbs) 99 97.6 98  BMI 18.71 18.44 18.52    Physical Exam Vitals reviewed.  Constitutional:      Appearance: Normal appearance.  HENT:     Head: Normocephalic and atraumatic.     Right Ear: Tympanic membrane, ear canal and external ear normal.     Left Ear: Tympanic membrane, ear canal and external ear normal.     Nose: Nose normal.     Mouth/Throat:     Mouth: Mucous membranes are moist.     Pharynx: Oropharynx is clear.  Eyes:     Extraocular Movements: Extraocular movements intact.     Conjunctiva/sclera: Conjunctivae normal.     Pupils: Pupils are equal, round, and reactive to light.  Cardiovascular:     Rate and Rhythm: Normal rate and regular rhythm.     Pulses: Normal pulses.     Heart sounds: Normal heart sounds.  Pulmonary:     Effort: Pulmonary effort is normal.     Breath sounds: Normal breath sounds.  Abdominal:     General: Abdomen is flat. Bowel sounds are normal.     Palpations: Abdomen is soft.  Musculoskeletal:        General: Normal range of motion.     Cervical back: Normal range of motion and neck supple.  Skin:    General: Skin is warm and dry.     Capillary Refill: Capillary refill takes less than 2 seconds.  Neurological:     General: No  focal deficit present.     Mental Status: She is alert and oriented to person, place, and time.     Diabetic Foot Exam - Simple   No data filed       Lab Results  Component Value Date   WBC 7.5 06/25/2019   HGB 13.6 06/25/2019   HCT 40.3 06/25/2019   PLT 330 06/25/2019   GLUCOSE 91  06/25/2019   CHOL 168 06/25/2019   TRIG 103 06/25/2019   HDL 69 06/25/2019   LDLCALC 81 06/25/2019   ALT 12 06/25/2019   AST 21 06/25/2019   NA 139 06/25/2019   K 5.2 06/25/2019   CL 102 06/25/2019   CREATININE 1.19 (H) 06/25/2019   BUN 12 06/25/2019   CO2 23 06/25/2019   TSH 2.180 06/25/2019      Assessment & Plan:   1. Other specified hypothyroidism - TSH Patient is known to have hypothyroidism and is n treatment with levothyroxine.  Patient was diagnosed 10 years ago.  Other treatment includes none.  Patient is compliant with medicines and last TSH 6 months ago.  Last TSH was normal.  2. Mixed hyperlipidemia - Lipid panel AN INDIVIDUAL CARE PLAN for hyperlipidemia/ cholesterol was established and reinforced today.  The patient's status was assessed using clinical findings on exam, lab and other diagnostic tests. The patient's disease status was assessed based on evidence-based guidelines and found to be well controlled. MEDICATIONS were reviewed. SELF MANAGEMENT GOALS have been discussed and patient's success at attaining the goal of low cholesterol was assessed. RECOMMENDATION given include regular exercise 3 days a week and low cholesterol/low fat diet. CLINICAL SUMMARY including written plan to identify barriers unique to the patient due to social or economic  reasons was discussed.  3. Vitamin B12 deficiency (non anemic) B12 level was low normal, need to recheck level  4. Essential hypertension - CBC with Differential/Platelet - Comprehensive metabolic panel An individual hypertension care plan was established and reinforced today.  The patient's status was assessed using  clinical findings on exam and labs or diagnostic tests. The patient's success at meeting treatment goals on disease specific evidence-based guidelines and found to be well controlled. SELF MANAGEMENT: The patient and I together assessed ways to personally work towards obtaining the recommended goals. RECOMMENDATIONS: avoid decongestants found in common cold remedies, decrease consumption of alcohol, perform routine monitoring of BP with home BP cuff, exercise, reduction of dietary salt, take medicines as prescribed, try not to miss doses and quit smoking.  Regular exercise and maintaining a healthy weight is needed.  Stress reduction may help. A CLINICAL SUMMARY including written plan identify barriers to care unique to individual due to social or financial issues.  We attempt to mutually creat solutions for individual and family understanding.      Orders Placed This Encounter  Procedures  . CBC with Differential/Platelet  . Comprehensive metabolic panel  . Lipid panel  . TSH     Follow-up: Return in about 6 months (around 04/28/2020) for fasting and mcr with kim.  An After Visit Summary was printed and given to the patient.  Brent Bulla [facility] [phone]

## 2019-10-28 LAB — LIPID PANEL
Chol/HDL Ratio: 2.4 ratio (ref 0.0–4.4)
Cholesterol, Total: 167 mg/dL (ref 100–199)
HDL: 71 mg/dL (ref >39)
LDL Chol Calc (NIH): 80 mg/dL (ref 0–99)
Triglycerides: 90 mg/dL (ref 0–149)
VLDL Cholesterol Cal: 16 mg/dL (ref 5–40)

## 2019-10-28 LAB — CBC WITH DIFFERENTIAL/PLATELET
Basophils Absolute: 0.1 10*3/uL (ref 0.0–0.2)
Basos: 1 %
EOS (ABSOLUTE): 0.1 10*3/uL (ref 0.0–0.4)
Eos: 2 %
Hematocrit: 38.6 % (ref 34.0–46.6)
Hemoglobin: 12.9 g/dL (ref 11.1–15.9)
Immature Grans (Abs): 0 10*3/uL (ref 0.0–0.1)
Immature Granulocytes: 0 %
Lymphocytes Absolute: 1.9 10*3/uL (ref 0.7–3.1)
Lymphs: 32 %
MCH: 30.4 pg (ref 26.6–33.0)
MCHC: 33.4 g/dL (ref 31.5–35.7)
MCV: 91 fL (ref 79–97)
Monocytes Absolute: 0.6 10*3/uL (ref 0.1–0.9)
Monocytes: 10 %
Neutrophils Absolute: 3.3 10*3/uL (ref 1.4–7.0)
Neutrophils: 55 %
Platelets: 289 10*3/uL (ref 150–450)
RBC: 4.25 x10E6/uL (ref 3.77–5.28)
RDW: 13 % (ref 11.7–15.4)
WBC: 6 10*3/uL (ref 3.4–10.8)

## 2019-10-28 LAB — COMPREHENSIVE METABOLIC PANEL
ALT: 14 IU/L (ref 0–32)
AST: 23 IU/L (ref 0–40)
Albumin/Globulin Ratio: 2 (ref 1.2–2.2)
Albumin: 4.8 g/dL (ref 3.8–4.8)
Alkaline Phosphatase: 77 IU/L (ref 48–121)
BUN/Creatinine Ratio: 13 (ref 12–28)
BUN: 14 mg/dL (ref 8–27)
Bilirubin Total: 0.4 mg/dL (ref 0.0–1.2)
CO2: 23 mmol/L (ref 20–29)
Calcium: 9.9 mg/dL (ref 8.7–10.3)
Chloride: 103 mmol/L (ref 96–106)
Creatinine, Ser: 1.09 mg/dL — ABNORMAL HIGH (ref 0.57–1.00)
GFR calc Af Amer: 61 mL/min/{1.73_m2} (ref >59)
GFR calc non Af Amer: 53 mL/min/{1.73_m2} — ABNORMAL LOW (ref >59)
Globulin, Total: 2.4 g/dL (ref 1.5–4.5)
Glucose: 88 mg/dL (ref 65–99)
Potassium: 5.2 mmol/L (ref 3.5–5.2)
Sodium: 139 mmol/L (ref 134–144)
Total Protein: 7.2 g/dL (ref 6.0–8.5)

## 2019-10-28 LAB — CARDIOVASCULAR RISK ASSESSMENT

## 2019-10-28 LAB — TSH: TSH: 2.03 u[IU]/mL (ref 0.450–4.500)

## 2019-10-28 NOTE — Progress Notes (Signed)
CBC normal, kidney tests stable, liver tests normal, Cholesterol normal, TSH 2.03 normal lp

## 2019-11-14 ENCOUNTER — Ambulatory Visit: Payer: Medicare Other

## 2019-12-16 DIAGNOSIS — H2703 Aphakia, bilateral: Secondary | ICD-10-CM | POA: Diagnosis not present

## 2019-12-16 DIAGNOSIS — H16223 Keratoconjunctivitis sicca, not specified as Sjogren's, bilateral: Secondary | ICD-10-CM | POA: Diagnosis not present

## 2019-12-17 ENCOUNTER — Ambulatory Visit (INDEPENDENT_AMBULATORY_CARE_PROVIDER_SITE_OTHER): Payer: Medicare Other

## 2019-12-17 DIAGNOSIS — E538 Deficiency of other specified B group vitamins: Secondary | ICD-10-CM

## 2019-12-17 NOTE — Progress Notes (Signed)
   Vit B12 injection given per order, patient tolerated well.   Jacklynn Bue, LPN 9:39 PM

## 2019-12-29 ENCOUNTER — Other Ambulatory Visit: Payer: Self-pay | Admitting: Legal Medicine

## 2019-12-29 DIAGNOSIS — K2100 Gastro-esophageal reflux disease with esophagitis, without bleeding: Secondary | ICD-10-CM

## 2019-12-29 DIAGNOSIS — E538 Deficiency of other specified B group vitamins: Secondary | ICD-10-CM

## 2020-01-14 ENCOUNTER — Ambulatory Visit (INDEPENDENT_AMBULATORY_CARE_PROVIDER_SITE_OTHER): Payer: Medicare Other

## 2020-01-14 ENCOUNTER — Other Ambulatory Visit: Payer: Self-pay

## 2020-01-14 DIAGNOSIS — E538 Deficiency of other specified B group vitamins: Secondary | ICD-10-CM | POA: Diagnosis not present

## 2020-02-12 ENCOUNTER — Ambulatory Visit (INDEPENDENT_AMBULATORY_CARE_PROVIDER_SITE_OTHER): Payer: Medicare Other

## 2020-02-12 DIAGNOSIS — E538 Deficiency of other specified B group vitamins: Secondary | ICD-10-CM

## 2020-02-12 NOTE — Progress Notes (Signed)
   B12 injection given per order, patient tolerated well.   Jacklynn Bue, LPN 3:83 PM

## 2020-02-23 ENCOUNTER — Other Ambulatory Visit: Payer: Self-pay | Admitting: Legal Medicine

## 2020-02-23 DIAGNOSIS — E039 Hypothyroidism, unspecified: Secondary | ICD-10-CM

## 2020-02-23 DIAGNOSIS — M810 Age-related osteoporosis without current pathological fracture: Secondary | ICD-10-CM

## 2020-03-23 ENCOUNTER — Other Ambulatory Visit: Payer: Self-pay

## 2020-03-23 ENCOUNTER — Encounter: Payer: Self-pay | Admitting: Legal Medicine

## 2020-03-23 ENCOUNTER — Ambulatory Visit (INDEPENDENT_AMBULATORY_CARE_PROVIDER_SITE_OTHER): Payer: Medicare Other

## 2020-03-23 DIAGNOSIS — Z23 Encounter for immunization: Secondary | ICD-10-CM | POA: Diagnosis not present

## 2020-03-23 NOTE — Progress Notes (Signed)
° °  Covid-19 Vaccination Clinic  Name:  Daryl Quiros    MRN: [id number] DOB: 1950-04-19  03/23/2020  Ms. Caterino was observed post Covid-19 immunization for 15 minutes without incident. She was provided with Vaccine Information Sheet and instruction to access the V-Safe system.   Ms. Kysar was instructed to call [phone] with any severe reactions post vaccine:  Difficulty breathing   Swelling of face and throat   A fast heartbeat   A bad rash all over body   Dizziness and weakness

## 2020-05-03 ENCOUNTER — Other Ambulatory Visit: Payer: Self-pay

## 2020-05-03 ENCOUNTER — Ambulatory Visit (INDEPENDENT_AMBULATORY_CARE_PROVIDER_SITE_OTHER): Payer: Medicare Other | Admitting: Legal Medicine

## 2020-05-03 ENCOUNTER — Encounter: Payer: Self-pay | Admitting: Legal Medicine

## 2020-05-03 VITALS — BP 106/68 | HR 65 | Temp 97.2°F | Resp 18 | Ht 60.0 in | Wt 94.8 lb

## 2020-05-03 DIAGNOSIS — E538 Deficiency of other specified B group vitamins: Secondary | ICD-10-CM

## 2020-05-03 DIAGNOSIS — E038 Other specified hypothyroidism: Secondary | ICD-10-CM | POA: Diagnosis not present

## 2020-05-03 DIAGNOSIS — I700 Atherosclerosis of aorta: Secondary | ICD-10-CM | POA: Diagnosis not present

## 2020-05-03 DIAGNOSIS — E782 Mixed hyperlipidemia: Secondary | ICD-10-CM

## 2020-05-03 DIAGNOSIS — I10 Essential (primary) hypertension: Secondary | ICD-10-CM

## 2020-05-03 NOTE — Progress Notes (Signed)
Subjective:  Patient ID: Barbara Duncan, female    DOB: 12/19/1950  Age: 70 y.o. MRN: [id number]  Chief Complaint  Patient presents with  . Hypertension    HPI: Chronic visit  Patient presents for follow up of hypertension.  Patient tolerating verapamil well with side effects.  Patient was diagnosed with hypertension 2010 so has been treated for hypertension for 10 years.Patient is working on maintaining diet and exercise regimen and follows up as directed. Complication include none  Patient presents with hyperlipidemia.  Compliance with treatment has been good; patient takes medicines as directed, maintains low cholesterol diet, follows up as directed, and maintains exercise regimen.  Patient is using pravastatin without problems..   Current Outpatient Medications on File Prior to Visit  Medication Sig Dispense Refill  . alendronate (FOSAMAX) 70 MG tablet TAKE 1 TABLET WEEKLY IN THE MORNIMG ON EMPTY STOMACH AT LEAST 30 MINUTES BEFORE FIRST FOOD/BEVERAGE,OR MEDICATIONSTAY UPRIGHT FOR AT LEAST AN HOUR 12 tablet 2  . aspirin EC 81 MG tablet Take 1 tablet (81 mg total) by mouth daily. 90 tablet 3  . cyanocobalamin (,VITAMIN B-12,) 1000 MCG/ML injection INJECT INTO THE MUSCLE ONCE A MONTH 30 mL 2  . levothyroxine (SYNTHROID) 25 MCG tablet TAKE ONE TABLET BY MOUTH ONCE DAILY 90 tablet 2  . pantoprazole (PROTONIX) 40 MG tablet TAKE ONE TABLET BY MOUTH ONCE DAILY 90 tablet 2  . pravastatin (PRAVACHOL) 20 MG tablet TAKE ONE TABLET BY MOUTH ONCE DAILY 90 tablet 2  . verapamil (CALAN-SR) 180 MG CR tablet TAKE ONE TABLET BY MOUTH EVERY MORNING 90 tablet 2  . verapamil (VERELAN PM) 180 MG 24 hr capsule Take 180 mg by mouth daily.     No current facility-administered medications on file prior to visit.   Past Medical History:  Diagnosis Date  . Acquired hypothyroidism   . Anemia   . Aortic atherosclerosis (HCC)   . Asthma   . Dyslipidemia   . Essential hypertension 07/05/2015  .  Hypothyroidism 06/25/2019  . Mixed hyperlipidemia 06/25/2019  . Osteoporosis   . Stroke (cerebrum) (HCC)   . Syncope   . Vitamin B12 deficiency (non anemic) 06/17/2019   Past Surgical History:  Procedure Laterality Date  . CARDIAC CATHETERIZATION    . CHOLECYSTECTOMY      Family History  Problem Relation Age of Onset  . Hypertension Mother   . Hyperlipidemia Mother   . Hypertension Father   . Hyperlipidemia Father    Social History   Socioeconomic History  . Marital status: Married    Spouse name: Not on file  . Number of children: Not on file  . Years of education: Not on file  . Highest education level: Not on file  Occupational History  . Not on file  Tobacco Use  . Smoking status: Never Smoker  . Smokeless tobacco: Never Used  Vaping Use  . Vaping Use: Never used  Substance and Sexual Activity  . Alcohol use: Never  . Drug use: Never  . Sexual activity: Not on file  Other Topics Concern  . Not on file  Social History Narrative  . Not on file   Social Determinants of Health   Financial Resource Strain: Not on file  Food Insecurity: Not on file  Transportation Needs: Not on file  Physical Activity: Not on file  Stress: Not on file  Social Connections: Not on file    Review of Systems  Constitutional: Negative for activity change and appetite change.  HENT:  Negative for congestion and sinus pain.   Eyes: Negative for visual disturbance.  Respiratory: Negative for apnea and shortness of breath.   Cardiovascular: Negative for palpitations and leg swelling.  Gastrointestinal: Negative for abdominal distention and abdominal pain.  Endocrine: Negative for polyuria.  Genitourinary: Negative for difficulty urinating, dyspareunia and urgency.  Musculoskeletal: Negative for arthralgias and back pain.  Skin: Negative.   Neurological: Negative.   Psychiatric/Behavioral: Negative for agitation.     Objective:  BP 106/68   Pulse 65   Temp (!) 97.2 F (36.2 C)    Resp 18   Ht 5' (1.524 m)   Wt 94 lb 12.8 oz (43 kg)   BMI 18.51 kg/m   BP/Weight 05/03/2020 10/27/2019 06/26/2019  Systolic BP 106 104 112  Diastolic BP 68 70 64  Wt. (Lbs) 94.8 99 97.6  BMI 18.51 18.71 18.44    Physical Exam Vitals reviewed.  Constitutional:      Appearance: Normal appearance.  HENT:     Head: Normocephalic and atraumatic.     Right Ear: Tympanic membrane normal.     Left Ear: Tympanic membrane normal.     Mouth/Throat:     Mouth: Mucous membranes are moist.     Pharynx: Oropharynx is clear.  Eyes:     Extraocular Movements: Extraocular movements intact.     Conjunctiva/sclera: Conjunctivae normal.     Pupils: Pupils are equal, round, and reactive to light.  Cardiovascular:     Rate and Rhythm: Normal rate and regular rhythm.     Pulses: Normal pulses.     Heart sounds: Normal heart sounds.  Pulmonary:     Effort: Pulmonary effort is normal.     Breath sounds: Normal breath sounds.  Abdominal:     General: Abdomen is flat. Bowel sounds are normal. There is no distension.     Palpations: Abdomen is soft.     Tenderness: There is no abdominal tenderness.  Musculoskeletal:        General: Normal range of motion.     Cervical back: Normal range of motion and neck supple.  Skin:    General: Skin is warm and dry.     Capillary Refill: Capillary refill takes less than 2 seconds.  Neurological:     General: No focal deficit present.     Mental Status: She is alert and oriented to person, place, and time. Mental status is at baseline.  Psychiatric:        Mood and Affect: Mood normal.       Lab Results  Component Value Date   WBC 6.0 10/27/2019   HGB 12.9 10/27/2019   HCT 38.6 10/27/2019   PLT 289 10/27/2019   GLUCOSE 88 10/27/2019   CHOL 167 10/27/2019   TRIG 90 10/27/2019   HDL 71 10/27/2019   LDLCALC 80 10/27/2019   ALT 14 10/27/2019   AST 23 10/27/2019   NA 139 10/27/2019   K 5.2 10/27/2019   CL 103 10/27/2019   CREATININE 1.09 (H)  10/27/2019   BUN 14 10/27/2019   CO2 23 10/27/2019   TSH 2.030 10/27/2019      Assessment & Plan:   1. B12 deficiency - Vitamin B12 AN INDIVIDUAL CARE PLAN for B12was established and reinforced today.  The patient's status was assessed using clinical findings on exam, labs, and other diagnostic testing. Patient's success at meeting treatment goals based on disease specific evidence-bassed guidelines and found to be in good control. RECOMMENDATIONS include maintaining present medicines  and treatment.B12 given today IM  2. Other specified hypothyroidism - TSH Patient is known to have hypothyroidism and is n treatment with levothyroxine .  Patient was diagnosed 20 years ago.  Other treatment includes none.  Patient is compliant with medicines and last TSH 6 months ago.  Last TSH was normal.  3. Mixed hyperlipidemia - Lipid panel AN INDIVIDUAL CARE PLAN for hyperlipidemia/ cholesterol was established and reinforced today.  The patient's status was assessed using clinical findings on exam, lab and other diagnostic tests. The patient's disease status was assessed based on evidence-based guidelines and found to be well controlled. MEDICATIONS were reviewed. SELF MANAGEMENT GOALS have been discussed and patient's success at attaining the goal of low cholesterol was assessed. RECOMMENDATION given include regular exercise 3 days a week and low cholesterol/low fat diet. CLINICAL SUMMARY including written plan to identify barriers unique to the patient due to social or economic  reasons was discussed.  4. Essential hypertension - Comprehensive metabolic panel - CBC with Differential/Platelet An individual hypertension care plan was established and reinforced today.  The patient's status was assessed using clinical findings on exam and labs or diagnostic tests. The patient's success at meeting treatment goals on disease specific evidence-based guidelines and found to be well controlled. SELF  MANAGEMENT: The patient and I together assessed ways to personally work towards obtaining the recommended goals. RECOMMENDATIONS: avoid decongestants found in common cold remedies, decrease consumption of alcohol, perform routine monitoring of BP with home BP cuff, exercise, reduction of dietary salt, take medicines as prescribed, try not to miss doses and quit smoking.  Regular exercise and maintaining a healthy weight is needed.  Stress reduction may help. A CLINICAL SUMMARY including written plan identify barriers to care unique to individual due to social or financial issues.  We attempt to mutually creat solutions for individual and family understanding.    No orders of the defined types were placed in this encounter.   Orders Placed This Encounter  Procedures  . Comprehensive metabolic panel  . Lipid panel  . CBC with Differential/Platelet  . TSH  . Vitamin B12      I spent 35 minutes dedicated to the care of this patient on the date of this encounter to include face-to-face time with the patient, as well as: review of old records  Follow-up: Return in about 6 months (around 10/31/2020).  An After Visit Summary was printed and given to the patient.  Brent Bulla, MD [facility] [phone]

## 2020-05-04 LAB — COMPREHENSIVE METABOLIC PANEL
ALT: 11 IU/L (ref 0–32)
AST: 21 IU/L (ref 0–40)
Albumin/Globulin Ratio: 2 (ref 1.2–2.2)
Albumin: 4.8 g/dL (ref 3.8–4.8)
Alkaline Phosphatase: 95 IU/L (ref 44–121)
BUN/Creatinine Ratio: 9 — ABNORMAL LOW (ref 12–28)
BUN: 10 mg/dL (ref 8–27)
Bilirubin Total: 0.4 mg/dL (ref 0.0–1.2)
CO2: 23 mmol/L (ref 20–29)
Calcium: 9.9 mg/dL (ref 8.7–10.3)
Chloride: 101 mmol/L (ref 96–106)
Creatinine, Ser: 1.11 mg/dL — ABNORMAL HIGH (ref 0.57–1.00)
GFR calc Af Amer: 59 mL/min/{1.73_m2} — ABNORMAL LOW (ref >59)
GFR calc non Af Amer: 52 mL/min/{1.73_m2} — ABNORMAL LOW (ref >59)
Globulin, Total: 2.4 g/dL (ref 1.5–4.5)
Glucose: 87 mg/dL (ref 65–99)
Potassium: 4.9 mmol/L (ref 3.5–5.2)
Sodium: 140 mmol/L (ref 134–144)
Total Protein: 7.2 g/dL (ref 6.0–8.5)

## 2020-05-04 LAB — LIPID PANEL
Chol/HDL Ratio: 2.5 ratio (ref 0.0–4.4)
Cholesterol, Total: 168 mg/dL (ref 100–199)
HDL: 68 mg/dL (ref >39)
LDL Chol Calc (NIH): 79 mg/dL (ref 0–99)
Triglycerides: 121 mg/dL (ref 0–149)
VLDL Cholesterol Cal: 21 mg/dL (ref 5–40)

## 2020-05-04 LAB — CBC WITH DIFFERENTIAL/PLATELET
Basophils Absolute: 0.1 10*3/uL (ref 0.0–0.2)
Basos: 2 %
EOS (ABSOLUTE): 0.1 10*3/uL (ref 0.0–0.4)
Eos: 1 %
Hematocrit: 39.5 % (ref 34.0–46.6)
Hemoglobin: 13.7 g/dL (ref 11.1–15.9)
Immature Grans (Abs): 0 10*3/uL (ref 0.0–0.1)
Immature Granulocytes: 1 %
Lymphocytes Absolute: 1.7 10*3/uL (ref 0.7–3.1)
Lymphs: 28 %
MCH: 31.1 pg (ref 26.6–33.0)
MCHC: 34.7 g/dL (ref 31.5–35.7)
MCV: 90 fL (ref 79–97)
Monocytes Absolute: 0.6 10*3/uL (ref 0.1–0.9)
Monocytes: 9 %
Neutrophils Absolute: 3.7 10*3/uL (ref 1.4–7.0)
Neutrophils: 59 %
Platelets: 305 10*3/uL (ref 150–450)
RBC: 4.41 x10E6/uL (ref 3.77–5.28)
RDW: 12.5 % (ref 11.7–15.4)
WBC: 6.2 10*3/uL (ref 3.4–10.8)

## 2020-05-04 LAB — CARDIOVASCULAR RISK ASSESSMENT

## 2020-05-04 LAB — TSH: TSH: 2.29 u[IU]/mL (ref 0.450–4.500)

## 2020-05-04 LAB — VITAMIN B12: Vitamin B-12: >2000 pg/mL — ABNORMAL HIGH (ref 232–1245)

## 2020-05-04 NOTE — Progress Notes (Signed)
Kidney tests stable, liver tests normal, Cholesterol normal, CBC normal, TSH 2.29 normal, vitamin B12 high >2000 lp

## 2020-05-05 DIAGNOSIS — Z1231 Encounter for screening mammogram for malignant neoplasm of breast: Secondary | ICD-10-CM | POA: Diagnosis not present

## 2020-05-05 DIAGNOSIS — Z01419 Encounter for gynecological examination (general) (routine) without abnormal findings: Secondary | ICD-10-CM | POA: Diagnosis not present

## 2020-05-05 DIAGNOSIS — Z124 Encounter for screening for malignant neoplasm of cervix: Secondary | ICD-10-CM | POA: Diagnosis not present

## 2020-05-26 ENCOUNTER — Encounter: Payer: Self-pay | Admitting: Legal Medicine

## 2020-06-28 ENCOUNTER — Other Ambulatory Visit: Payer: Self-pay | Admitting: Legal Medicine

## 2020-09-13 ENCOUNTER — Ambulatory Visit (INDEPENDENT_AMBULATORY_CARE_PROVIDER_SITE_OTHER): Payer: Medicare Other

## 2020-09-13 DIAGNOSIS — Z23 Encounter for immunization: Secondary | ICD-10-CM

## 2020-09-27 DIAGNOSIS — Z1231 Encounter for screening mammogram for malignant neoplasm of breast: Secondary | ICD-10-CM | POA: Diagnosis not present

## 2020-11-01 ENCOUNTER — Other Ambulatory Visit: Payer: Self-pay

## 2020-11-01 ENCOUNTER — Encounter: Payer: Self-pay | Admitting: Legal Medicine

## 2020-11-01 ENCOUNTER — Ambulatory Visit (INDEPENDENT_AMBULATORY_CARE_PROVIDER_SITE_OTHER): Payer: Medicare Other | Admitting: Legal Medicine

## 2020-11-01 VITALS — BP 120/60 | HR 76 | Temp 96.4°F | Resp 16 | Ht 60.0 in | Wt 87.4 lb

## 2020-11-01 DIAGNOSIS — E038 Other specified hypothyroidism: Secondary | ICD-10-CM | POA: Diagnosis not present

## 2020-11-01 DIAGNOSIS — I10 Essential (primary) hypertension: Secondary | ICD-10-CM

## 2020-11-01 DIAGNOSIS — E782 Mixed hyperlipidemia: Secondary | ICD-10-CM | POA: Diagnosis not present

## 2020-11-01 DIAGNOSIS — I700 Atherosclerosis of aorta: Secondary | ICD-10-CM

## 2020-11-01 NOTE — Progress Notes (Signed)
Established Patient Office Visit  Subjective:  Patient ID: Barbara Duncan, female    DOB: [birth year]  Age: 70 y.o. MRN: [id number]  CC:  Chief Complaint  Patient presents with   Hypothyroidism   Hyperlipidemia    HPI Barbara Duncan presents for chronic visit  Patient presents for follow up of hypertension.  Patient tolerating verapamil well with side effects.  Patient was diagnosed with hypertension 2010 so has been treated for hypertension for 10 years.Patient is working on maintaining diet and exercise regimen and follows up as directed. Complication include none  Patient has HYPOTHYROIDISM.  Diagnosed 10 years ago.  Patient has stable thyroid readings.  Patient is having no symptoms.  Last TSH was normal.  continue dosage of thyroid medicine. .   Past Medical History:  Diagnosis Date   Acquired hypothyroidism    Anemia    Aortic atherosclerosis (HCC)    Asthma    Dyslipidemia    Essential hypertension 07/05/2015   Hypothyroidism 06/25/2019   Mixed hyperlipidemia 06/25/2019   Osteoporosis    Stroke (cerebrum) (HCC)    Syncope    Vitamin B12 deficiency (non anemic) 06/17/2019    Past Surgical History:  Procedure Laterality Date   CARDIAC CATHETERIZATION     CHOLECYSTECTOMY      Family History  Problem Relation Age of Onset   Hypertension Mother    Hyperlipidemia Mother    Hypertension Father    Hyperlipidemia Father     Social History   Socioeconomic History   Marital status: Married    Spouse name: Not on file   Number of children: Not on file   Years of education: Not on file   Highest education level: Not on file  Occupational History   Not on file  Tobacco Use   Smoking status: Never   Smokeless tobacco: Never  Vaping Use   Vaping Use: Never used  Substance and Sexual Activity   Alcohol use: Never   Drug use: Never   Sexual activity: Not on file  Other Topics Concern   Not on file  Social History Narrative   Not on file   Social Determinants of  Health   Financial Resource Strain: Not on file  Food Insecurity: Not on file  Transportation Needs: Not on file  Physical Activity: Not on file  Stress: Not on file  Social Connections: Not on file  Intimate Partner Violence: Not on file    Outpatient Medications Prior to Visit  Medication Sig Dispense Refill   alendronate (FOSAMAX) 70 MG tablet TAKE 1 TABLET WEEKLY IN THE MORNIMG ON EMPTY STOMACH AT LEAST 30 MINUTES BEFORE FIRST FOOD/BEVERAGE,OR MEDICATIONSTAY UPRIGHT FOR AT LEAST AN HOUR 12 tablet 2   aspirin EC 81 MG tablet Take 1 tablet (81 mg total) by mouth daily. 90 tablet 3   levothyroxine (SYNTHROID) 25 MCG tablet TAKE ONE TABLET BY MOUTH ONCE DAILY 90 tablet 2   pantoprazole (PROTONIX) 40 MG tablet TAKE ONE TABLET BY MOUTH ONCE DAILY 90 tablet 2   pravastatin (PRAVACHOL) 20 MG tablet TAKE ONE TABLET BY MOUTH EVERY DAY 90 tablet 2   verapamil (VERELAN PM) 180 MG 24 hr capsule Take 180 mg by mouth daily.     cyanocobalamin (,VITAMIN B-12,) 1000 MCG/ML injection INJECT INTO THE MUSCLE ONCE A MONTH 30 mL 2   verapamil (CALAN-SR) 180 MG CR tablet TAKE ONE TABLET BY MOUTH EVERY MORNING 90 tablet 2   No facility-administered medications prior to visit.    No  Known Allergies  ROS Review of Systems  Constitutional:  Negative for activity change and appetite change.  HENT:  Negative for congestion.   Eyes:  Negative for visual disturbance.  Respiratory:  Negative for chest tightness and shortness of breath.   Cardiovascular:  Negative for chest pain and palpitations.  Gastrointestinal:  Positive for abdominal pain and diarrhea.  Endocrine: Negative for polyuria.  Genitourinary:  Negative for difficulty urinating and dysuria.  Musculoskeletal:  Negative for arthralgias and back pain.  Skin: Negative.   Neurological: Negative.   Psychiatric/Behavioral: Negative.       Objective:    Physical Exam Vitals reviewed.  Constitutional:      Appearance: Normal appearance.   HENT:     Head: Normocephalic and atraumatic.     Right Ear: Tympanic membrane, ear canal and external ear normal.     Left Ear: Tympanic membrane, ear canal and external ear normal.     Nose: Nose normal.     Mouth/Throat:     Mouth: Mucous membranes are dry.     Pharynx: Oropharynx is clear.  Eyes:     Extraocular Movements: Extraocular movements intact.     Conjunctiva/sclera: Conjunctivae normal.     Pupils: Pupils are equal, round, and reactive to light.  Cardiovascular:     Rate and Rhythm: Normal rate and regular rhythm.     Pulses: Normal pulses.     Heart sounds: Normal heart sounds. No murmur heard.   No gallop.  Pulmonary:     Effort: Pulmonary effort is normal. No respiratory distress.     Breath sounds: Normal breath sounds. No wheezing.  Abdominal:     General: Abdomen is flat. Bowel sounds are normal. There is no distension.     Palpations: Abdomen is soft.     Tenderness: There is no abdominal tenderness.  Musculoskeletal:        General: Normal range of motion.     Cervical back: Normal range of motion.  Skin:    General: Skin is warm.     Capillary Refill: Capillary refill takes less than 2 seconds.  Neurological:     General: No focal deficit present.     Mental Status: She is alert and oriented to person, place, and time. Mental status is at baseline.    BP 120/60   Pulse 76   Temp (!) 96.4 F (35.8 C)   Resp 16   Ht 5' (1.524 m)   Wt 87 lb 6.4 oz (39.6 kg)   SpO2 99%   BMI 17.07 kg/m  Wt Readings from Last 3 Encounters:  11/01/20 87 lb 6.4 oz (39.6 kg)  05/03/20 94 lb 12.8 oz (43 kg)  10/27/19 99 lb (44.9 kg)     Health Maintenance Due  Topic Date Due   Hepatitis C Screening  Never done   Zoster Vaccines- Shingrix (1 of 2) Never done   PNA vac Low Risk Adult (2 of 2 - PCV13) 01/16/2019    There are no preventive care reminders to display for this patient.  Lab Results  Component Value Date   TSH 2.290 05/03/2020   Lab Results   Component Value Date   WBC 6.2 05/03/2020   HGB 13.7 05/03/2020   HCT 39.5 05/03/2020   MCV 90 05/03/2020   PLT 305 05/03/2020   Lab Results  Component Value Date   NA 140 05/03/2020   K 4.9 05/03/2020   CO2 23 05/03/2020   GLUCOSE 87 05/03/2020  BUN 10 05/03/2020   CREATININE 1.11 (H) 05/03/2020   BILITOT 0.4 05/03/2020   ALKPHOS 95 05/03/2020   AST 21 05/03/2020   ALT 11 05/03/2020   PROT 7.2 05/03/2020   ALBUMIN 4.8 05/03/2020   CALCIUM 9.9 05/03/2020   Lab Results  Component Value Date   CHOL 168 05/03/2020   Lab Results  Component Value Date   HDL 68 05/03/2020   Lab Results  Component Value Date   LDLCALC 79 05/03/2020   Lab Results  Component Value Date   TRIG 121 05/03/2020   Lab Results  Component Value Date   CHOLHDL 2.5 05/03/2020   No results found for: HGBA1C    Assessment & Plan:   Problem List Items Addressed This Visit       Cardiovascular and Mediastinum   Essential hypertension   Relevant Orders   Comprehensive metabolic panel   CBC with Differential/Platelet An individual hypertension care plan was established and reinforced today.  The patient's status was assessed using clinical findings on exam and labs or diagnostic tests. The patient's success at meeting treatment goals on disease specific evidence-based guidelines and found to be well controlled. SELF MANAGEMENT: The patient and I together assessed ways to personally work towards obtaining the recommended goals. RECOMMENDATIONS: avoid decongestants found in common cold remedies, decrease consumption of alcohol, perform routine monitoring of BP with home BP cuff, exercise, reduction of dietary salt, take medicines as prescribed, try not to miss doses and quit smoking.  Regular exercise and maintaining a healthy weight is needed.  Stress reduction may help. A CLINICAL SUMMARY including written plan identify barriers to care unique to individual due to social or financial issues.  We  attempt to mutually creat solutions for individual and family understanding.    Aortic atherosclerosis (HCC) - Primary Patient has aortic atherosclerosis and is on statin     Endocrine   Hypothyroidism   Relevant Orders   TSH Patient is known to have hypothyroidism and is n treatment with levothyroxine .  Patient was diagnosed 10 years ago.  Other treatment includes none.  Patient is compliant with medicines and last TSH 6 months ago.  Last TSH was normal.      Other   Mixed hyperlipidemia   Relevant Orders   Lipid panel AN INDIVIDUAL CARE PLAN for hyperlipidemia/ cholesterol was established and reinforced today.  The patient's status was assessed using clinical findings on exam, lab and other diagnostic tests. The patient's disease status was assessed based on evidence-based guidelines and found to be fair controlled. MEDICATIONS were reviewed. SELF MANAGEMENT GOALS have been discussed and patient's success at attaining the goal of low cholesterol was assessed. RECOMMENDATION given include regular exercise 3 days a week and low cholesterol/low fat diet. CLINICAL SUMMARY including written plan to identify barriers unique to the patient due to social or economic  reasons was discussed.    30 minute visit with review of records   Follow-up: Return in about 6 months (around 05/04/2021) for fasting.    Brent Bulla, MD

## 2020-11-02 LAB — CBC WITH DIFFERENTIAL/PLATELET
Basophils Absolute: 0.1 10*3/uL (ref 0.0–0.2)
Basos: 2 %
EOS (ABSOLUTE): 0.2 10*3/uL (ref 0.0–0.4)
Eos: 3 %
Hematocrit: 41.3 % (ref 34.0–46.6)
Hemoglobin: 13.7 g/dL (ref 11.1–15.9)
Immature Grans (Abs): 0 10*3/uL (ref 0.0–0.1)
Immature Granulocytes: 0 %
Lymphocytes Absolute: 2 10*3/uL (ref 0.7–3.1)
Lymphs: 29 %
MCH: 30.1 pg (ref 26.6–33.0)
MCHC: 33.2 g/dL (ref 31.5–35.7)
MCV: 91 fL (ref 79–97)
Monocytes Absolute: 0.7 10*3/uL (ref 0.1–0.9)
Monocytes: 10 %
Neutrophils Absolute: 3.9 10*3/uL (ref 1.4–7.0)
Neutrophils: 56 %
Platelets: 286 10*3/uL (ref 150–450)
RBC: 4.55 x10E6/uL (ref 3.77–5.28)
RDW: 12.6 % (ref 11.7–15.4)
WBC: 6.8 10*3/uL (ref 3.4–10.8)

## 2020-11-02 LAB — COMPREHENSIVE METABOLIC PANEL
ALT: 10 IU/L (ref 0–32)
AST: 19 IU/L (ref 0–40)
Albumin/Globulin Ratio: 2.2 (ref 1.2–2.2)
Albumin: 4.9 g/dL — ABNORMAL HIGH (ref 3.8–4.8)
Alkaline Phosphatase: 83 IU/L (ref 44–121)
BUN/Creatinine Ratio: 11 — ABNORMAL LOW (ref 12–28)
BUN: 12 mg/dL (ref 8–27)
Bilirubin Total: 0.4 mg/dL (ref 0.0–1.2)
CO2: 22 mmol/L (ref 20–29)
Calcium: 10.6 mg/dL — ABNORMAL HIGH (ref 8.7–10.3)
Chloride: 103 mmol/L (ref 96–106)
Creatinine, Ser: 1.06 mg/dL — ABNORMAL HIGH (ref 0.57–1.00)
Globulin, Total: 2.2 g/dL (ref 1.5–4.5)
Glucose: 94 mg/dL (ref 65–99)
Potassium: 5.7 mmol/L — ABNORMAL HIGH (ref 3.5–5.2)
Sodium: 142 mmol/L (ref 134–144)
Total Protein: 7.1 g/dL (ref 6.0–8.5)
eGFR: 57 mL/min/{1.73_m2} — ABNORMAL LOW (ref >59)

## 2020-11-02 LAB — LIPID PANEL
Chol/HDL Ratio: 2.1 ratio (ref 0.0–4.4)
Cholesterol, Total: 163 mg/dL (ref 100–199)
HDL: 78 mg/dL (ref >39)
LDL Chol Calc (NIH): 67 mg/dL (ref 0–99)
Triglycerides: 98 mg/dL (ref 0–149)
VLDL Cholesterol Cal: 18 mg/dL (ref 5–40)

## 2020-11-02 LAB — CARDIOVASCULAR RISK ASSESSMENT

## 2020-11-02 LAB — TSH: TSH: 1.47 u[IU]/mL (ref 0.450–4.500)

## 2020-11-02 NOTE — Progress Notes (Signed)
Kidneys stage 3a renal disease, mild, potassium high 5.7 needs repeat in one week, calcium high add PTH level, liver tests normal, TSH normal, cholesterol normal, cbc normal lp

## 2020-11-10 ENCOUNTER — Other Ambulatory Visit: Payer: Medicare Other

## 2020-11-10 DIAGNOSIS — E875 Hyperkalemia: Secondary | ICD-10-CM | POA: Diagnosis not present

## 2020-11-11 LAB — COMPREHENSIVE METABOLIC PANEL
ALT: 13 IU/L (ref 0–32)
AST: 18 IU/L (ref 0–40)
Albumin/Globulin Ratio: 2.4 — ABNORMAL HIGH (ref 1.2–2.2)
Albumin: 4.8 g/dL (ref 3.8–4.8)
Alkaline Phosphatase: 81 IU/L (ref 44–121)
BUN/Creatinine Ratio: 12 (ref 12–28)
BUN: 13 mg/dL (ref 8–27)
Bilirubin Total: 0.3 mg/dL (ref 0.0–1.2)
CO2: 23 mmol/L (ref 20–29)
Calcium: 10 mg/dL (ref 8.7–10.3)
Chloride: 103 mmol/L (ref 96–106)
Creatinine, Ser: 1.11 mg/dL — ABNORMAL HIGH (ref 0.57–1.00)
Globulin, Total: 2 g/dL (ref 1.5–4.5)
Glucose: 88 mg/dL (ref 65–99)
Potassium: 5.3 mmol/L — ABNORMAL HIGH (ref 3.5–5.2)
Sodium: 137 mmol/L (ref 134–144)
Total Protein: 6.8 g/dL (ref 6.0–8.5)
eGFR: 54 mL/min/{1.73_m2} — ABNORMAL LOW (ref >59)

## 2020-11-11 NOTE — Progress Notes (Signed)
Kidney tests stage 3a mild, potassium 5.3 high, it is improved, we will watch, normal is 5.2 lp

## 2020-11-22 ENCOUNTER — Other Ambulatory Visit: Payer: Self-pay | Admitting: Legal Medicine

## 2020-11-22 DIAGNOSIS — E039 Hypothyroidism, unspecified: Secondary | ICD-10-CM

## 2020-12-22 DIAGNOSIS — H2703 Aphakia, bilateral: Secondary | ICD-10-CM | POA: Diagnosis not present

## 2021-02-10 ENCOUNTER — Other Ambulatory Visit: Payer: Self-pay | Admitting: Legal Medicine

## 2021-02-17 DIAGNOSIS — S0502XA Injury of conjunctiva and corneal abrasion without foreign body, left eye, initial encounter: Secondary | ICD-10-CM | POA: Diagnosis not present

## 2021-03-29 ENCOUNTER — Other Ambulatory Visit: Payer: Self-pay | Admitting: Legal Medicine

## 2021-04-14 IMAGING — CT CT CHEST W/O CM
2 of 3 series · 15 of 36 positions shown, 18 images · non-contrast
Comparison: 06/08/2014 CT scan

CLINICAL DATA: Shortness of breath

EXAM:
CT CHEST WITHOUT CONTRAST
TECHNIQUE: Multidetector CT imaging of the chest was performed following the
standard protocol without IV contrast.

[Series 2: thorax · axial · 0.62mm/px · z∈[-263,-41]mm · 12 of 131 slices shown, 15 images]
[im 10/131  mediastinal]
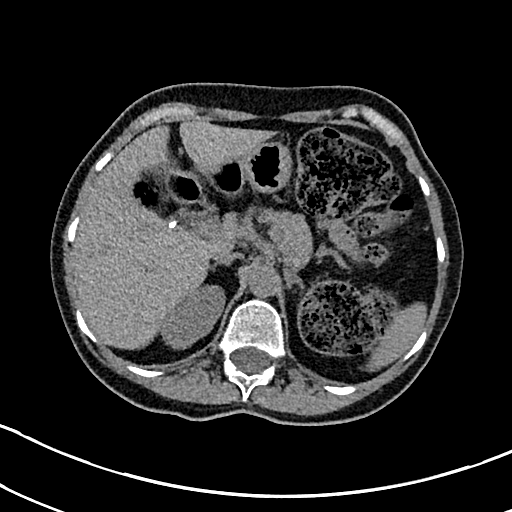
[im 10/131  lung]
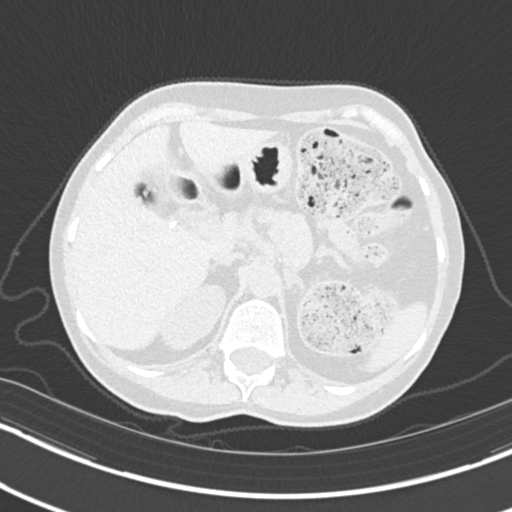
[im 20/131  lung]
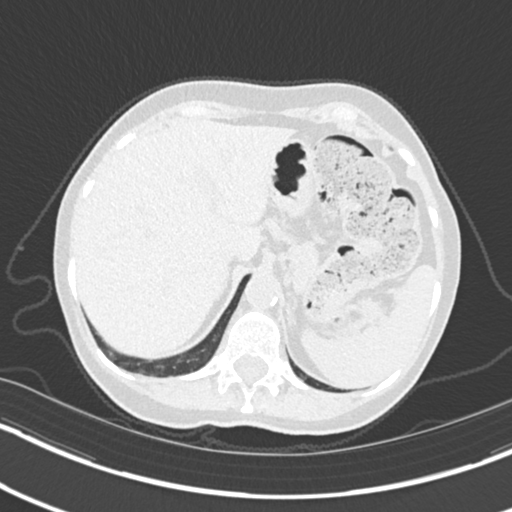
[im 29/131  lung]
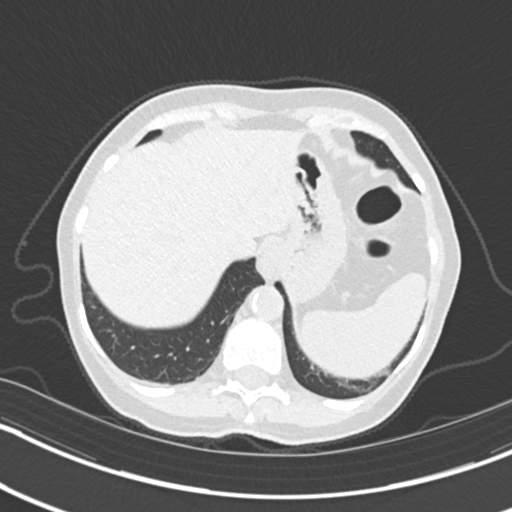
[im 39/131  lung]
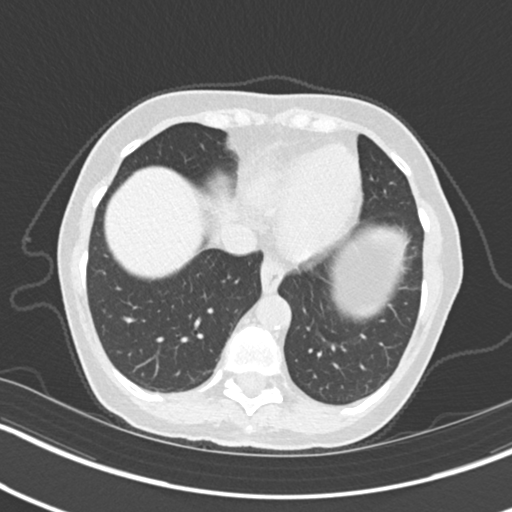
[im 49/131  mediastinal]
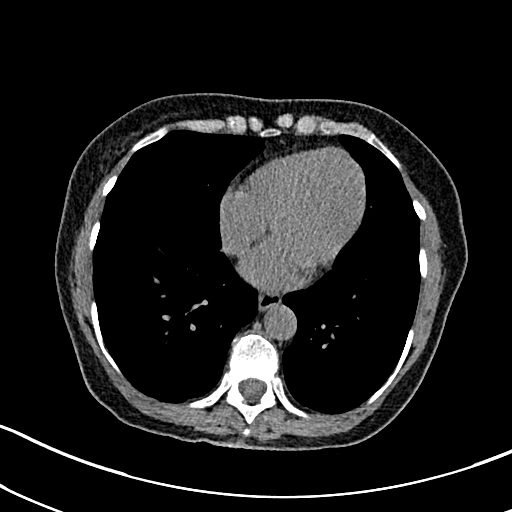
[im 49/131  lung]
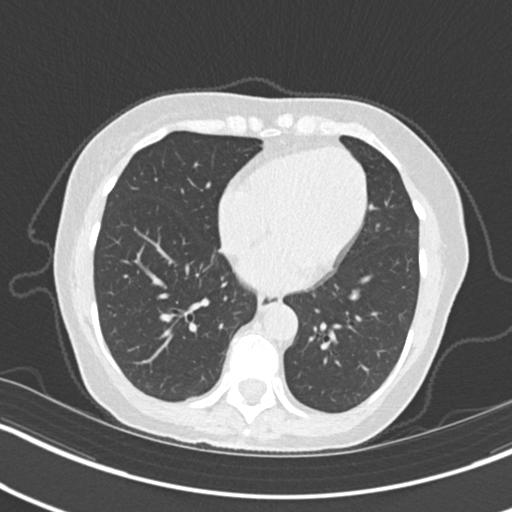
[im 58/131  lung]
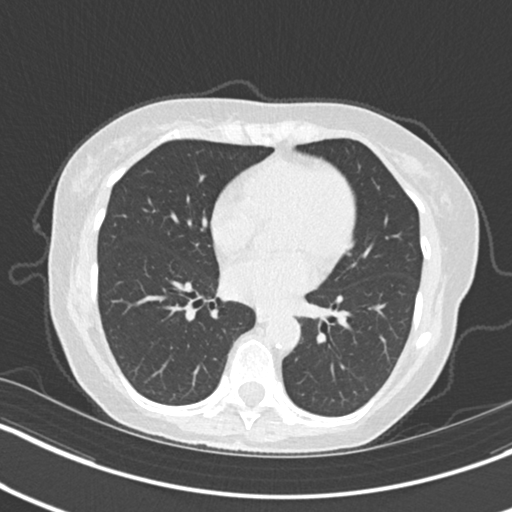
[im 73/131  lung]
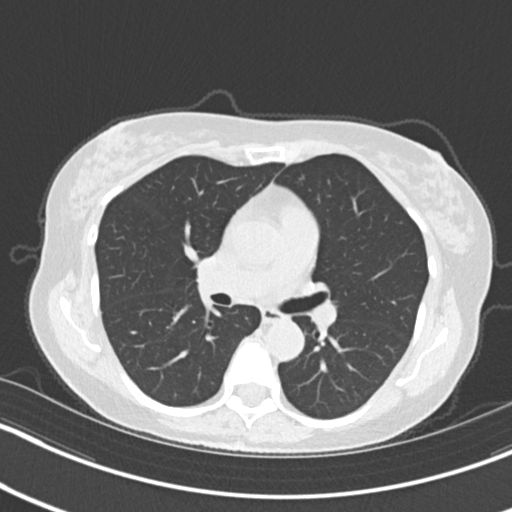
[im 82/131  lung]
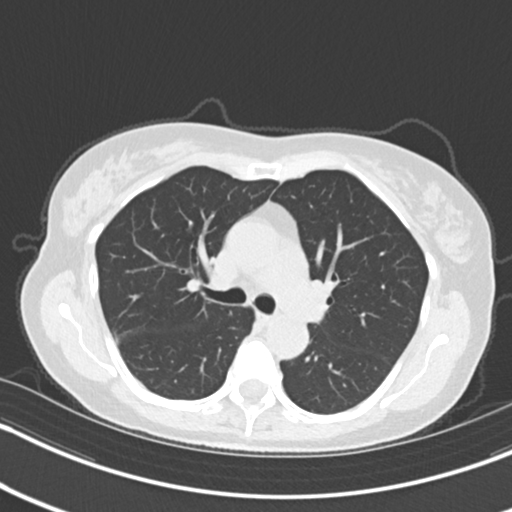
[im 92/131  mediastinal]
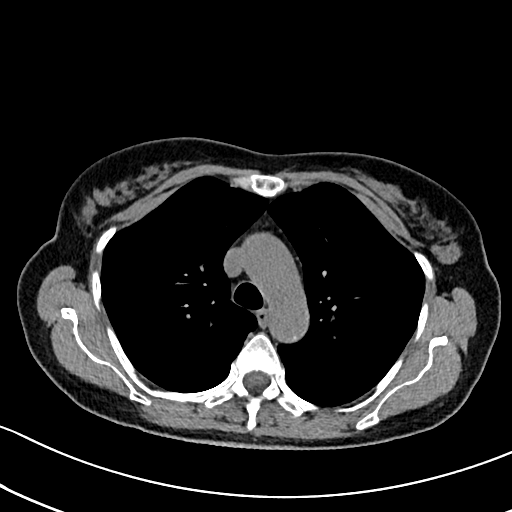
[im 92/131  lung]
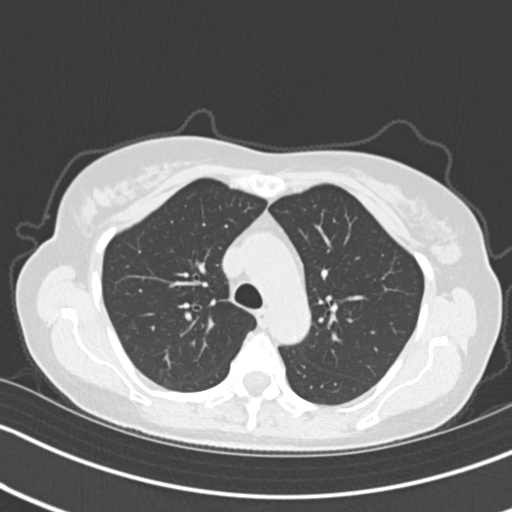
[im 102/131  lung]
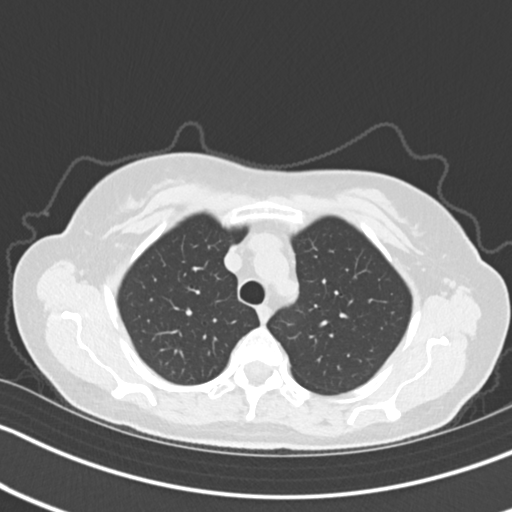
[im 111/131  lung]
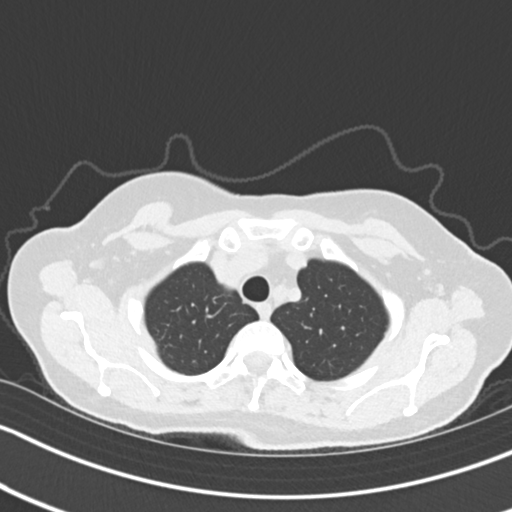
[im 121/131  lung]
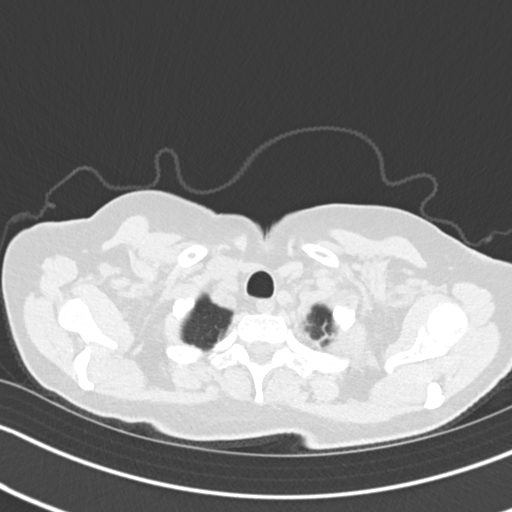

[Series 5: coronal · coronal · 0.57mm/px · 3 of 116 slices shown]
[im 24/116  lung]
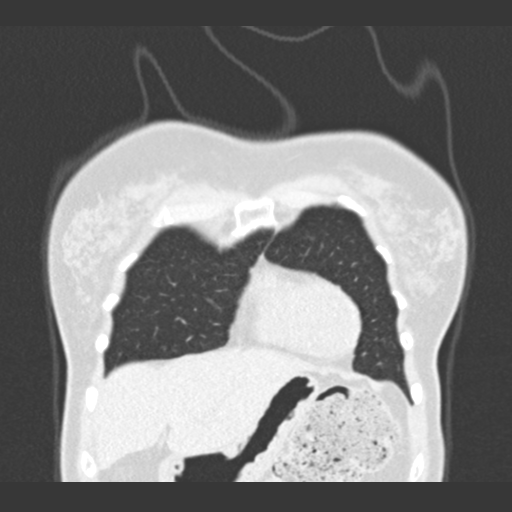
[im 47/116  lung]
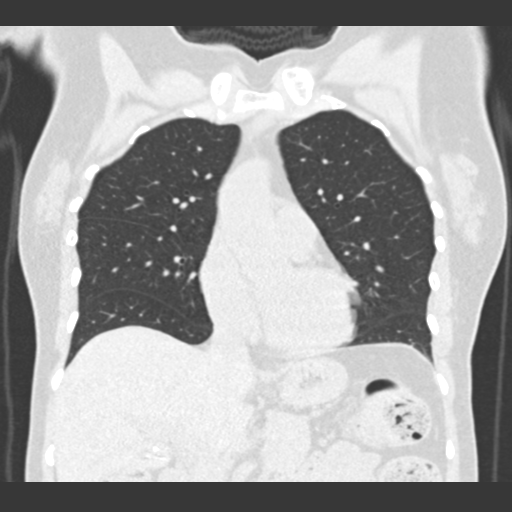
[im 70/116  lung]
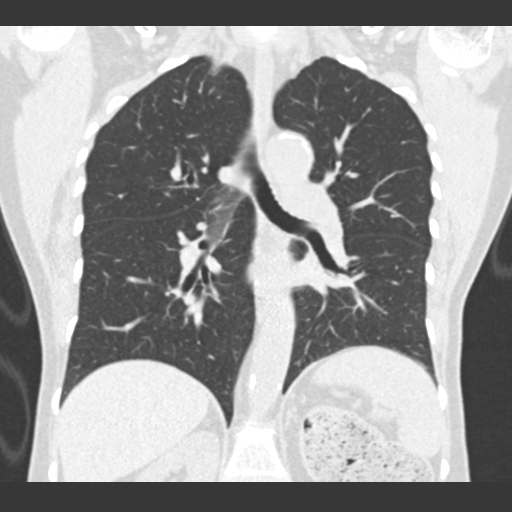

[15 of 36 positions shown; findings below may reference images not displayed]

FINDINGS: Cardiovascular: Atherosclerotic calcification of the aortic arch and
descending thoracic aorta.

Mediastinum/Nodes: Small type 1 hiatal hernia.

Lungs/Pleura: Mild biapical pleuroparenchymal scarring. Minimal
scarring posteriorly in the right upper lobe, unchanged from 8051.
The lungs appear otherwise clear.

Upper Abdomen: Cholecystectomy. Prominence of stool in the upper
abdominal colon. Atrophic upper margin of the left kidney compatible
with patient's known severe left renal atrophy.

Musculoskeletal: Midthoracic spondylosis.
IMPRESSION: 1. A cause for the patient's shortness of breath is not identified.
2. Small type 1 hiatal hernia.
3. Atrophic upper margin of the left kidney compatible with
patient's known severe left renal atrophy.
4. Prominence of stool in the upper abdominal colon, query
constipation.
5.  Aortic Atherosclerosis (PYFND-4V6.6).

## 2021-05-09 ENCOUNTER — Ambulatory Visit (INDEPENDENT_AMBULATORY_CARE_PROVIDER_SITE_OTHER): Payer: Medicare Other | Admitting: Legal Medicine

## 2021-05-09 ENCOUNTER — Encounter: Payer: Self-pay | Admitting: Legal Medicine

## 2021-05-09 ENCOUNTER — Other Ambulatory Visit: Payer: Self-pay

## 2021-05-09 VITALS — BP 100/70 | HR 78 | Temp 97.7°F | Resp 15 | Ht 60.0 in | Wt 87.0 lb

## 2021-05-09 DIAGNOSIS — E039 Hypothyroidism, unspecified: Secondary | ICD-10-CM | POA: Diagnosis not present

## 2021-05-09 DIAGNOSIS — E038 Other specified hypothyroidism: Secondary | ICD-10-CM | POA: Diagnosis not present

## 2021-05-09 DIAGNOSIS — E782 Mixed hyperlipidemia: Secondary | ICD-10-CM | POA: Diagnosis not present

## 2021-05-09 DIAGNOSIS — Z23 Encounter for immunization: Secondary | ICD-10-CM

## 2021-05-09 DIAGNOSIS — I700 Atherosclerosis of aorta: Secondary | ICD-10-CM | POA: Diagnosis not present

## 2021-05-09 DIAGNOSIS — I10 Essential (primary) hypertension: Secondary | ICD-10-CM

## 2021-05-09 DIAGNOSIS — M810 Age-related osteoporosis without current pathological fracture: Secondary | ICD-10-CM

## 2021-05-09 MED ORDER — PRAVASTATIN SODIUM 20 MG PO TABS: 20.0000 mg | ORAL_TABLET | Freq: Every day | ORAL | 2 refills | Status: DC

## 2021-05-09 MED ORDER — LEVOTHYROXINE SODIUM 25 MCG PO TABS: 25.0000 ug | ORAL_TABLET | Freq: Every day | ORAL | 2 refills | Status: DC

## 2021-05-09 MED ORDER — VERAPAMIL HCL ER 180 MG PO TBCR: 180.0000 mg | EXTENDED_RELEASE_TABLET | Freq: Every morning | ORAL | 2 refills | Status: DC

## 2021-05-09 NOTE — Progress Notes (Signed)
Subjective:  Patient ID: Barbara Duncan, female    DOB: 1951/03/15  Age: 71 y.o. MRN: [id number]  Chief Complaint  Patient presents with   Hyperlipidemia   Hypothyroidism   Hypertension    HPI Hypertension: She is taking Verapamil 180 mg daily, aspirin 81 mg daily. She checks blood pressure everyday. Average systolic is [id number] an diastolic is [id number].  Hyperlipidemia: She takes pravastatin 20 mg everyday. Patient presents with hyperlipidemia.  Compliance with treatment has been good; patient takes medicines as directed, maintains low cholesterol diet, follows up as directed, and maintains exercise regimen.  Patient is using pravastatin without problems.   Hypothyroidism: Currently taking levothyroxine 25 mcg daily. She is stable without problems    Current Outpatient Medications on File Prior to Visit  Medication Sig Dispense Refill   aspirin EC 81 MG tablet Take 1 tablet (81 mg total) by mouth daily. 90 tablet 3   pantoprazole (PROTONIX) 40 MG tablet TAKE ONE TABLET BY MOUTH ONCE DAILY 90 tablet 2   trimethoprim-polymyxin b (POLYTRIM) ophthalmic solution Place into the right eye.     alendronate (FOSAMAX) 70 MG tablet TAKE 1 TABLET WEEKLY IN THE MORNIMG ON EMPTY STOMACH AT LEAST 30 MINUTES BEFORE FIRST FOOD/BEVERAGE,OR MEDICATIONSTAY UPRIGHT FOR AT LEAST AN HOUR (Patient not taking: Reported on 05/09/2021) 12 tablet 2   No current facility-administered medications on file prior to visit.   Past Medical History:  Diagnosis Date   Acquired hypothyroidism    Anemia    Aortic atherosclerosis ([place])    Asthma    Dyslipidemia    Essential hypertension 07/05/2015   Hypothyroidism 06/25/2019   Mixed hyperlipidemia 06/25/2019   Osteoporosis    Stroke (cerebrum) (HCC)    Syncope    Vitamin B12 deficiency (non anemic) 06/17/2019   Past Surgical History:  Procedure Laterality Date   CARDIAC CATHETERIZATION     CHOLECYSTECTOMY      Family History  Problem Relation Age of Onset    Hypertension Mother    Hyperlipidemia Mother    Hypertension Father    Hyperlipidemia Father    Social History   Socioeconomic History   Marital status: Married    Spouse name: Not on file   Number of children: Not on file   Years of education: Not on file   Highest education level: Not on file  Occupational History   Not on file  Tobacco Use   Smoking status: Never   Smokeless tobacco: Never  Vaping Use   Vaping Use: Never used  Substance and Sexual Activity   Alcohol use: Never   Drug use: Never   Sexual activity: Not on file  Other Topics Concern   Not on file  Social History Narrative   Not on file   Social Determinants of Health   Financial Resource Strain: Not on file  Food Insecurity: Not on file  Transportation Needs: Not on file  Physical Activity: Not on file  Stress: Not on file  Social Connections: Not on file    Review of Systems  Constitutional:  Negative for chills, fatigue and fever.  HENT:  Negative for congestion, ear pain and sore throat.   Eyes:  Negative for visual disturbance.  Respiratory:  Negative for cough and shortness of breath.   Cardiovascular:  Negative for chest pain and palpitations.  Gastrointestinal:  Negative for abdominal pain, constipation, diarrhea, nausea and vomiting.  Endocrine: Negative for polydipsia, polyphagia and polyuria.  Genitourinary:  Negative for difficulty urinating and dysuria.  Musculoskeletal:  Negative for arthralgias, back pain and myalgias.  Skin:  Negative for rash.  Neurological:  Negative for headaches.  Psychiatric/Behavioral:  Negative for dysphoric mood. The patient is not nervous/anxious.     Objective:  BP 100/70    Pulse 78    Temp 97.7 F (36.5 C)    Resp 15    Ht 5' (1.524 m)    Wt 87 lb (39.5 kg)    SpO2 100%    BMI 16.99 kg/m   BP/Weight 05/09/2021 11/01/2020 [id number]  Systolic BP [id number]  Diastolic BP 70 60 68  Wt. (Lbs) 87 87.4 94.8  BMI 16.99 17.07 18.51    Physical  Exam Vitals reviewed.  Constitutional:      General: She is not in acute distress.    Appearance: Normal appearance. She is normal weight.  HENT:     Head: Normocephalic.     Right Ear: Tympanic membrane, ear canal and external ear normal.     Left Ear: Tympanic membrane, ear canal and external ear normal.     Nose: Nose normal.     Mouth/Throat:     Mouth: Mucous membranes are moist.     Pharynx: Oropharynx is clear. No posterior oropharyngeal erythema.  Eyes:     Extraocular Movements: Extraocular movements intact.     Conjunctiva/sclera: Conjunctivae normal.     Pupils: Pupils are equal, round, and reactive to light.  Neck:     Vascular: No carotid bruit.  Cardiovascular:     Rate and Rhythm: Normal rate and regular rhythm.     Pulses: Normal pulses.     Heart sounds: Normal heart sounds. No murmur heard.   No gallop.  Pulmonary:     Effort: Pulmonary effort is normal. No respiratory distress.     Breath sounds: Normal breath sounds. No wheezing.  Abdominal:     General: Bowel sounds are normal. There is no distension.     Palpations: Abdomen is soft. There is no mass.     Tenderness: There is no abdominal tenderness.  Musculoskeletal:        General: Normal range of motion.     Cervical back: Normal range of motion. No tenderness.     Right lower leg: No edema.     Left lower leg: No edema.  Skin:    General: Skin is warm.     Capillary Refill: Capillary refill takes less than 2 seconds.  Neurological:     General: No focal deficit present.     Mental Status: She is alert and oriented to person, place, and time. Mental status is at baseline.     Gait: Gait normal.     Deep Tendon Reflexes: Reflexes normal.  Psychiatric:        Mood and Affect: Mood normal.        Behavior: Behavior normal.        Thought Content: Thought content normal.        Lab Results  Component Value Date   WBC 6.8 11/01/2020   HGB 13.7 11/01/2020   HCT 41.3 11/01/2020   PLT 286  11/01/2020   GLUCOSE 88 11/10/2020   CHOL 163 11/01/2020   TRIG 98 11/01/2020   HDL 78 11/01/2020   LDLCALC 67 11/01/2020   ALT 13 11/10/2020   AST 18 11/10/2020   NA 137 11/10/2020   K 5.3 (H) 11/10/2020   CL 103 11/10/2020   CREATININE 1.11 (H) 11/10/2020   BUN 13 11/10/2020  CO2 23 11/10/2020   TSH 1.470 11/01/2020      Assessment & Plan:   Problem List Items Addressed This Visit       Cardiovascular and Mediastinum   Essential hypertension - Primary   Relevant Medications   pravastatin (PRAVACHOL) 20 MG tablet   verapamil (CALAN-SR) 180 MG CR tablet   Other Relevant Orders   Comprehensive metabolic panel   CBC with Differential/Platelet An individual hypertension care plan was established and reinforced today.  The patient's status was assessed using clinical findings on exam and labs or diagnostic tests. The patient's success at meeting treatment goals on disease specific evidence-based guidelines and found to be well controlled. SELF MANAGEMENT: The patient and I together assessed ways to personally work towards obtaining the recommended goals. RECOMMENDATIONS: avoid decongestants found in common cold remedies, decrease consumption of alcohol, perform routine monitoring of BP with home BP cuff, exercise, reduction of dietary salt, take medicines as prescribed, try not to miss doses and quit smoking.  Regular exercise and maintaining a healthy weight is needed.  Stress reduction may help. A CLINICAL SUMMARY including written plan identify barriers to care unique to individual due to social or financial issues.  We attempt to mutually creat solutions for individual and family understanding.     Aortic atherosclerosis (HCC)   Relevant Medications   pravastatin (PRAVACHOL) 20 MG tablet   verapamil (CALAN-SR) 180 MG CR tablet Aortic atherosclerosis on statin, we discussed increasing pravastatin     Endocrine   Hypothyroidism   Relevant Medications   levothyroxine  (SYNTHROID) 25 MCG tablet   Other Relevant Orders   TSH Patient is known to have hypothyroid and is n treatment with levothyroxine 23mcg.  Patient was diagnosed 1 years ago.  Other treatment includes none.  Patient is compliant with medicines and last TSH 6 months ago.  Last TSH was number.      Other   Mixed hyperlipidemia   Relevant Medications   pravastatin (PRAVACHOL) 20 MG tablet   verapamil (CALAN-SR) 180 MG CR tablet   Other Relevant Orders   Lipid panel AN INDIVIDUAL CARE PLAN for hyperlipidemia/ cholesterol was established and reinforced today.  The patient's status was assessed using clinical findings on exam, lab and other diagnostic tests. The patient's disease status was assessed based on evidence-based guidelines and found to be well controlled. MEDICATIONS were reviewed. SELF MANAGEMENT GOALS have been discussed and patient's success at attaining the goal of low cholesterol was assessed. RECOMMENDATION given include regular exercise 3 days a week and low cholesterol/low fat diet. CLINICAL SUMMARY including written plan to identify barriers unique to the patient due to social or economic  reasons was discussed.    Other Visit Diagnoses     Postmenopausal bone loss       Relevant Orders   DG BONE DENSITY (DXA) Recheck CXA last scan 2019   Encounter for immunization       Relevant Orders   Moderna Covid-19 Vaccine Bivalent Booster (Completed)   Need for immunization against influenza       Relevant Orders   Flu Vaccine QUAD High Dose(Fluad) (Completed)     .  Meds ordered this encounter  Medications   levothyroxine (SYNTHROID) 25 MCG tablet    Sig: Take 1 tablet (25 mcg total) by mouth daily.    Dispense:  90 tablet    Refill:  2   pravastatin (PRAVACHOL) 20 MG tablet    Sig: Take 1 tablet (20 mg total) by mouth  daily.    Dispense:  90 tablet    Refill:  2   verapamil (CALAN-SR) 180 MG CR tablet    Sig: Take 1 tablet (180 mg total) by mouth every morning.     Dispense:  90 tablet    Refill:  2    Orders Placed This Encounter  Procedures   DG BONE DENSITY (DXA)   Moderna Covid-19 Vaccine Bivalent Booster   Flu Vaccine QUAD High Dose(Fluad)   Comprehensive metabolic panel   Lipid panel   TSH   CBC with Differential/Platelet     Follow-up: Return in about 6 months (around 11/06/2021) for chronic fasting.  An After Visit Summary was printed and given to the patient.  Reinaldo Meeker, MD [facility] [phone]

## 2021-05-10 LAB — COMPREHENSIVE METABOLIC PANEL
ALT: 8 IU/L (ref 0–32)
AST: 25 IU/L (ref 0–40)
Albumin/Globulin Ratio: 2.3 — ABNORMAL HIGH (ref 1.2–2.2)
Albumin: 4.9 g/dL — ABNORMAL HIGH (ref 3.8–4.8)
Alkaline Phosphatase: 80 IU/L (ref 44–121)
BUN/Creatinine Ratio: 11 — ABNORMAL LOW (ref 12–28)
BUN: 11 mg/dL (ref 8–27)
Bilirubin Total: 0.4 mg/dL (ref 0.0–1.2)
CO2: 24 mmol/L (ref 20–29)
Calcium: 10.4 mg/dL — ABNORMAL HIGH (ref 8.7–10.3)
Chloride: 101 mmol/L (ref 96–106)
Creatinine, Ser: 0.99 mg/dL (ref 0.57–1.00)
Globulin, Total: 2.1 g/dL (ref 1.5–4.5)
Glucose: 90 mg/dL (ref 70–99)
Potassium: 5.4 mmol/L — ABNORMAL HIGH (ref 3.5–5.2)
Sodium: 138 mmol/L (ref 134–144)
Total Protein: 7 g/dL (ref 6.0–8.5)
eGFR: 62 mL/min/{1.73_m2} (ref >59)

## 2021-05-10 LAB — CBC WITH DIFFERENTIAL/PLATELET
Basophils Absolute: 0.1 10*3/uL (ref 0.0–0.2)
Basos: 1 %
EOS (ABSOLUTE): 0.1 10*3/uL (ref 0.0–0.4)
Eos: 2 %
Hematocrit: 39.8 % (ref 34.0–46.6)
Hemoglobin: 13.4 g/dL (ref 11.1–15.9)
Immature Grans (Abs): 0 10*3/uL (ref 0.0–0.1)
Immature Granulocytes: 0 %
Lymphocytes Absolute: 1.9 10*3/uL (ref 0.7–3.1)
Lymphs: 34 %
MCH: 30.5 pg (ref 26.6–33.0)
MCHC: 33.7 g/dL (ref 31.5–35.7)
MCV: 91 fL (ref 79–97)
Monocytes Absolute: 0.5 10*3/uL (ref 0.1–0.9)
Monocytes: 10 %
Neutrophils Absolute: 3 10*3/uL (ref 1.4–7.0)
Neutrophils: 53 %
Platelets: 297 10*3/uL (ref 150–450)
RBC: 4.39 x10E6/uL (ref 3.77–5.28)
RDW: 13 % (ref 11.7–15.4)
WBC: 5.6 10*3/uL (ref 3.4–10.8)

## 2021-05-10 LAB — LIPID PANEL
Chol/HDL Ratio: 2.1 ratio (ref 0.0–4.4)
Cholesterol, Total: 169 mg/dL (ref 100–199)
HDL: 79 mg/dL (ref >39)
LDL Chol Calc (NIH): 74 mg/dL (ref 0–99)
Triglycerides: 85 mg/dL (ref 0–149)
VLDL Cholesterol Cal: 16 mg/dL (ref 5–40)

## 2021-05-10 LAB — TSH: TSH: 1.22 u[IU]/mL (ref 0.450–4.500)

## 2021-05-10 LAB — CARDIOVASCULAR RISK ASSESSMENT

## 2021-05-10 NOTE — Progress Notes (Signed)
Kidney tests normal, potasium 5.4 recheck one week- high, calcium still 10.4, liver tests normal, TSH 1.22 normal, Cholesterol normal, CBC normal lp

## 2021-05-11 ENCOUNTER — Telehealth: Payer: Self-pay | Admitting: Legal Medicine

## 2021-05-11 NOTE — Telephone Encounter (Signed)
° °  Barbara Duncan has been scheduled for the following appointment:  WHAT: BONE DENSITY WHERE: [facility] DATE: 05/19/21 TIME: 10:30 AM ARRIVAL TIME  A message has been left for the patient.

## 2021-05-19 DIAGNOSIS — N959 Unspecified menopausal and perimenopausal disorder: Secondary | ICD-10-CM | POA: Diagnosis not present

## 2021-05-19 DIAGNOSIS — Z8739 Personal history of other diseases of the musculoskeletal system and connective tissue: Secondary | ICD-10-CM | POA: Diagnosis not present

## 2021-05-19 LAB — HM DEXA SCAN

## 2021-05-20 ENCOUNTER — Encounter: Payer: Self-pay | Admitting: Legal Medicine

## 2021-05-20 ENCOUNTER — Ambulatory Visit: Payer: Medicare Other

## 2021-05-20 ENCOUNTER — Other Ambulatory Visit: Payer: Self-pay

## 2021-05-20 DIAGNOSIS — I10 Essential (primary) hypertension: Secondary | ICD-10-CM | POA: Diagnosis not present

## 2021-05-20 DIAGNOSIS — M810 Age-related osteoporosis without current pathological fracture: Secondary | ICD-10-CM | POA: Insufficient documentation

## 2021-05-20 LAB — COMPREHENSIVE METABOLIC PANEL
ALT: 7 IU/L (ref 0–32)
AST: 20 IU/L (ref 0–40)
Albumin/Globulin Ratio: 2.2 (ref 1.2–2.2)
Albumin: 4.8 g/dL (ref 3.8–4.8)
Alkaline Phosphatase: 81 IU/L (ref 44–121)
BUN/Creatinine Ratio: 11 — ABNORMAL LOW (ref 12–28)
BUN: 11 mg/dL (ref 8–27)
Bilirubin Total: 0.4 mg/dL (ref 0.0–1.2)
CO2: 22 mmol/L (ref 20–29)
Calcium: 10 mg/dL (ref 8.7–10.3)
Chloride: 101 mmol/L (ref 96–106)
Creatinine, Ser: 1.01 mg/dL — ABNORMAL HIGH (ref 0.57–1.00)
Globulin, Total: 2.2 g/dL (ref 1.5–4.5)
Glucose: 92 mg/dL (ref 70–99)
Potassium: 5.6 mmol/L — ABNORMAL HIGH (ref 3.5–5.2)
Sodium: 137 mmol/L (ref 134–144)
Total Protein: 7 g/dL (ref 6.0–8.5)
eGFR: 61 mL/min/{1.73_m2} (ref >59)

## 2021-05-22 NOTE — Progress Notes (Signed)
Kidney creatinine 1.01, liver tests normal, potassium still 5.6, do not take any extra calcium recheck potassium one month lp

## 2021-05-23 ENCOUNTER — Other Ambulatory Visit: Payer: Self-pay

## 2021-05-23 DIAGNOSIS — M810 Age-related osteoporosis without current pathological fracture: Secondary | ICD-10-CM

## 2021-05-23 MED ORDER — ALENDRONATE SODIUM 70 MG PO TABS: 70.0000 mg | ORAL_TABLET | ORAL | 2 refills | Status: DC

## 2021-11-07 DIAGNOSIS — Z1231 Encounter for screening mammogram for malignant neoplasm of breast: Secondary | ICD-10-CM | POA: Diagnosis not present

## 2021-11-07 LAB — HM MAMMOGRAPHY

## 2021-11-07 NOTE — Progress Notes (Unsigned)
Subjective:  Patient ID: Barbara Duncan, female    DOB: 04/06/1951  Age: 71 y.o. MRN: [id number]  No chief complaint on file.   HPI Patient presents with hyperlipidemia.  Compliance with treatment has been good; patient takes medicines as directed, maintains low cholesterol diet, follows up as directed, and maintains exercise regimen.  Patient is using Pravastatin 20 mg daily without problems.    Osteoporosis: She is taking Fosamax 70 mg weekly.  Patient presents for follow up of hypertension.  Patient tolerating Verapamil 180 mg every morning, Aspirin 81 mg daily well without side effects. Patient is working on maintaining diet and exercise regimen and follows up as directed.   Hypothyroidism: She takes Levothyroxine 25 mcg daily.  Current Outpatient Medications on File Prior to Visit  Medication Sig Dispense Refill   alendronate (FOSAMAX) 70 MG tablet Take 1 tablet (70 mg total) by mouth once a week. Take with a full glass of water on an empty stomach. 12 tablet 2   aspirin EC 81 MG tablet Take 1 tablet (81 mg total) by mouth daily. 90 tablet 3   levothyroxine (SYNTHROID) 25 MCG tablet Take 1 tablet (25 mcg total) by mouth daily. 90 tablet 2   pantoprazole (PROTONIX) 40 MG tablet TAKE ONE TABLET BY MOUTH ONCE DAILY 90 tablet 2   pravastatin (PRAVACHOL) 20 MG tablet Take 1 tablet (20 mg total) by mouth daily. 90 tablet 2   trimethoprim-polymyxin b (POLYTRIM) ophthalmic solution Place into the right eye.     verapamil (CALAN-SR) 180 MG CR tablet Take 1 tablet (180 mg total) by mouth every morning. 90 tablet 2   No current facility-administered medications on file prior to visit.   Past Medical History:  Diagnosis Date   Acquired hypothyroidism    Anemia    Aortic atherosclerosis (HCC)    Asthma    Dyslipidemia    Essential hypertension 07/05/2015   Hypothyroidism 06/25/2019   Mixed hyperlipidemia 06/25/2019   Osteoporosis    Stroke (cerebrum) (HCC)    Syncope    Vitamin B12  deficiency (non anemic) 06/17/2019   Past Surgical History:  Procedure Laterality Date   CARDIAC CATHETERIZATION     CHOLECYSTECTOMY      Family History  Problem Relation Age of Onset   Hypertension Mother    Hyperlipidemia Mother    Hypertension Father    Hyperlipidemia Father    Social History   Socioeconomic History   Marital status: Married    Spouse name: Not on file   Number of children: Not on file   Years of education: Not on file   Highest education level: Not on file  Occupational History   Not on file  Tobacco Use   Smoking status: Never   Smokeless tobacco: Never  Vaping Use   Vaping Use: Never used  Substance and Sexual Activity   Alcohol use: Never   Drug use: Never   Sexual activity: Not on file  Other Topics Concern   Not on file  Social History Narrative   Not on file   Social Determinants of Health   Financial Resource Strain: Not on file  Food Insecurity: Not on file  Transportation Needs: Not on file  Physical Activity: Not on file  Stress: Not on file  Social Connections: Not on file    Review of Systems   Objective:  There were no vitals taken for this visit.     05/09/2021    9:43 AM 11/01/2020    9:51  AM 05/03/2020    8:07 AM  BP/Weight  Systolic BP 100 120 106  Diastolic BP 70 60 68  Wt. (Lbs) 87 87.4 94.8  BMI 16.99 kg/m2 17.07 kg/m2 18.51 kg/m2    Physical Exam  Diabetic Foot Exam - Simple   No data filed      Lab Results  Component Value Date   WBC 5.6 05/09/2021   HGB 13.4 05/09/2021   HCT 39.8 05/09/2021   PLT 297 05/09/2021   GLUCOSE 92 05/20/2021   CHOL 169 05/09/2021   TRIG 85 05/09/2021   HDL 79 05/09/2021   LDLCALC 74 05/09/2021   ALT 7 05/20/2021   AST 20 05/20/2021   NA 137 05/20/2021   K 5.6 (H) 05/20/2021   CL 101 05/20/2021   CREATININE 1.01 (H) 05/20/2021   BUN 11 05/20/2021   CO2 22 05/20/2021   TSH 1.220 05/09/2021      Assessment & Plan:   Problem List Items Addressed This Visit        Cardiovascular and Mediastinum   Essential hypertension - Primary   Aortic atherosclerosis (HCC)     Endocrine   Hypothyroidism     Musculoskeletal and Integument   Osteoporosis     Other   Mixed hyperlipidemia  .  No orders of the defined types were placed in this encounter.   No orders of the defined types were placed in this encounter.    Follow-up: No follow-ups on file.  An After Visit Summary was printed and given to the patient.  Brent Bulla, MD [facility] [phone]

## 2021-11-08 ENCOUNTER — Encounter: Payer: Self-pay | Admitting: Legal Medicine

## 2021-11-08 ENCOUNTER — Ambulatory Visit (INDEPENDENT_AMBULATORY_CARE_PROVIDER_SITE_OTHER): Payer: Medicare Other | Admitting: Legal Medicine

## 2021-11-08 VITALS — BP 120/64 | HR 70 | Temp 95.6°F | Ht 60.0 in | Wt 89.4 lb

## 2021-11-08 DIAGNOSIS — E782 Mixed hyperlipidemia: Secondary | ICD-10-CM | POA: Diagnosis not present

## 2021-11-08 DIAGNOSIS — I10 Essential (primary) hypertension: Secondary | ICD-10-CM

## 2021-11-08 DIAGNOSIS — E038 Other specified hypothyroidism: Secondary | ICD-10-CM

## 2021-11-08 DIAGNOSIS — I700 Atherosclerosis of aorta: Secondary | ICD-10-CM

## 2021-11-08 DIAGNOSIS — Z681 Body mass index (BMI) 19 or less, adult: Secondary | ICD-10-CM

## 2021-11-08 DIAGNOSIS — Z1159 Encounter for screening for other viral diseases: Secondary | ICD-10-CM

## 2021-11-08 DIAGNOSIS — M818 Other osteoporosis without current pathological fracture: Secondary | ICD-10-CM | POA: Diagnosis not present

## 2021-11-08 HISTORY — DX: Body mass index (BMI) 19.9 or less, adult: Z68.1

## 2021-11-09 LAB — LIPID PANEL
Chol/HDL Ratio: 2.5 ratio (ref 0.0–4.4)
Cholesterol, Total: 173 mg/dL (ref 100–199)
HDL: 70 mg/dL (ref >39)
LDL Chol Calc (NIH): 83 mg/dL (ref 0–99)
Triglycerides: 116 mg/dL (ref 0–149)
VLDL Cholesterol Cal: 20 mg/dL (ref 5–40)

## 2021-11-09 LAB — COMPREHENSIVE METABOLIC PANEL
ALT: 10 IU/L (ref 0–32)
AST: 19 IU/L (ref 0–40)
Albumin/Globulin Ratio: 2 (ref 1.2–2.2)
Albumin: 4.8 g/dL (ref 3.9–4.9)
Alkaline Phosphatase: 77 IU/L (ref 44–121)
BUN/Creatinine Ratio: 11 — ABNORMAL LOW (ref 12–28)
BUN: 11 mg/dL (ref 8–27)
Bilirubin Total: 0.4 mg/dL (ref 0.0–1.2)
CO2: 25 mmol/L (ref 20–29)
Calcium: 10.5 mg/dL — ABNORMAL HIGH (ref 8.7–10.3)
Chloride: 94 mmol/L — ABNORMAL LOW (ref 96–106)
Creatinine, Ser: 1.04 mg/dL — ABNORMAL HIGH (ref 0.57–1.00)
Globulin, Total: 2.4 g/dL (ref 1.5–4.5)
Glucose: 87 mg/dL (ref 70–99)
Potassium: 5.2 mmol/L (ref 3.5–5.2)
Sodium: 145 mmol/L — ABNORMAL HIGH (ref 134–144)
Total Protein: 7.2 g/dL (ref 6.0–8.5)
eGFR: 58 mL/min/{1.73_m2} — ABNORMAL LOW (ref >59)

## 2021-11-09 LAB — CBC WITH DIFFERENTIAL/PLATELET
Basophils Absolute: 0.1 10*3/uL (ref 0.0–0.2)
Basos: 2 %
EOS (ABSOLUTE): 0.2 10*3/uL (ref 0.0–0.4)
Eos: 3 %
Hematocrit: 39.7 % (ref 34.0–46.6)
Hemoglobin: 13.3 g/dL (ref 11.1–15.9)
Immature Grans (Abs): 0 10*3/uL (ref 0.0–0.1)
Immature Granulocytes: 0 %
Lymphocytes Absolute: 1.7 10*3/uL (ref 0.7–3.1)
Lymphs: 31 %
MCH: 30.4 pg (ref 26.6–33.0)
MCHC: 33.5 g/dL (ref 31.5–35.7)
MCV: 91 fL (ref 79–97)
Monocytes Absolute: 0.6 10*3/uL (ref 0.1–0.9)
Monocytes: 10 %
Neutrophils Absolute: 3.1 10*3/uL (ref 1.4–7.0)
Neutrophils: 54 %
Platelets: 301 10*3/uL (ref 150–450)
RBC: 4.37 x10E6/uL (ref 3.77–5.28)
RDW: 13 % (ref 11.7–15.4)
WBC: 5.6 10*3/uL (ref 3.4–10.8)

## 2021-11-09 LAB — TSH: TSH: 2.62 u[IU]/mL (ref 0.450–4.500)

## 2021-11-09 LAB — HEPATITIS C ANTIBODY: Hep C Virus Ab: NONREACTIVE

## 2021-11-09 LAB — CARDIOVASCULAR RISK ASSESSMENT

## 2021-11-09 LAB — VITAMIN D 25 HYDROXY (VIT D DEFICIENCY, FRACTURES): Vit D, 25-Hydroxy: 40.5 ng/mL (ref 30.0–100.0)

## 2021-11-09 NOTE — Progress Notes (Signed)
Kidney tests stage 3a, mild, calcium 10.5, liver tests normal, Hepatitis c negative, Cholesterol normal, CBC normal, TSH 2.62 normal, Vitamin D 40.5 normal,  lp

## 2021-11-10 ENCOUNTER — Telehealth: Payer: Self-pay

## 2021-11-10 NOTE — Telephone Encounter (Signed)
Patient made aware of labs, Verbalized understanding.

## 2021-12-01 ENCOUNTER — Telehealth: Payer: Self-pay | Admitting: Family Medicine

## 2021-12-01 NOTE — Telephone Encounter (Signed)
Declined scheduling AWV " mark [place] off your list" we do not feel we need these visits.

## 2021-12-05 DIAGNOSIS — H43393 Other vitreous opacities, bilateral: Secondary | ICD-10-CM | POA: Diagnosis not present

## 2022-02-08 ENCOUNTER — Other Ambulatory Visit: Payer: Self-pay | Admitting: Legal Medicine

## 2022-02-08 DIAGNOSIS — M810 Age-related osteoporosis without current pathological fracture: Secondary | ICD-10-CM

## 2022-02-28 ENCOUNTER — Other Ambulatory Visit: Payer: Self-pay | Admitting: Legal Medicine

## 2022-02-28 DIAGNOSIS — E039 Hypothyroidism, unspecified: Secondary | ICD-10-CM

## 2022-05-14 NOTE — Assessment & Plan Note (Signed)
Well controlled.  No changes to medicines. Pravastatin 20 mg daily. Continue to work on eating a healthy diet and exercise.  Labs drawn today.  

## 2022-05-14 NOTE — Assessment & Plan Note (Signed)
Discontinue Alendronate 70 mg weekly. Recommend evenity.  Sent for benefits verification.

## 2022-05-14 NOTE — Assessment & Plan Note (Signed)
Well controlled.  No changes to medicines. Verapamil 180 mg every morning, aspirin 81 mg daily. Continue to work on eating a healthy diet and exercise.  Labs drawn today.

## 2022-05-14 NOTE — Assessment & Plan Note (Signed)
Previously well controlled Continue Synthroid at current dose  Recheck TSH and adjust Synthroid as indicated   

## 2022-05-14 NOTE — Progress Notes (Unsigned)
Subjective:  Patient ID: Barbara Duncan, female    DOB: Aug 25, 1950  Age: 72 y.o. MRN: [id number]  Chief Complaint  Patient presents with   Hypothyroidism   Hyperlipidemia    HPI Patient presents with hyperlipidemia. Patient is using Pravastatin 20 mg daily without problems.    Osteoporosis: She is taking Fosamax 70 mg weekly. Patient has been on fosamax more than 5 years per patient. Bone density reviewed. T score very poor.   Patient presents for follow up of hypertension.  Patient tolerating Verapamil 180 mg every morning, Aspirin 81 mg daily well without side effects.   Hypothyroidism: She takes Levothyroxine 25 mcg daily.  GERD: on pantoprazole 40 mg daily as needed.      05/15/2022    8:57 AM 11/08/2021    9:03 AM 11/01/2020    9:53 AM 10/27/2019    9:03 AM  Depression screen PHQ 2/9  Decreased Interest 0 0 0 0  Down, Depressed, Hopeless 0 0 0 0  PHQ - 2 Score 0 0 0 0         05/09/2021   10:00 AM 11/08/2021    9:04 AM 05/15/2022    8:57 AM  Fall Risk  Falls in the past year? 0 0 0  Was there an injury with Fall? 0 0 0  Fall Risk Category Calculator 0 0 0  Fall Risk Category (Retired) Low Low   (RETIRED) Patient Fall Risk Level Low fall risk Low fall risk   Patient at Risk for Falls Due to  No Fall Risks No Fall Risks  Fall risk Follow up Falls evaluation completed Education provided Falls evaluation completed      Review of Systems  Constitutional:  Negative for appetite change, fatigue and fever.  HENT:  Negative for congestion, ear pain, sinus pressure and sore throat.   Respiratory:  Negative for cough, chest tightness, shortness of breath and wheezing.   Cardiovascular:  Negative for chest pain and palpitations.  Gastrointestinal:  Negative for abdominal pain, constipation, diarrhea, nausea and vomiting.  Genitourinary:  Negative for dysuria and hematuria.  Musculoskeletal:  Negative for arthralgias, back pain, joint swelling and myalgias.  Skin:  Negative for  rash.  Neurological:  Negative for dizziness, weakness and headaches.  Psychiatric/Behavioral:  Negative for dysphoric mood. The patient is not nervous/anxious.     Current Outpatient Medications on File Prior to Visit  Medication Sig Dispense Refill   aspirin EC 81 MG tablet Take 1 tablet (81 mg total) by mouth daily. 90 tablet 3   Cholecalciferol (VITAMIN D3) 1.25 MG (50000 UT) TABS Take by mouth.     levothyroxine (SYNTHROID) 25 MCG tablet TAKE ONE TABLET BY MOUTH EVERY DAY 90 tablet 2   pantoprazole (PROTONIX) 40 MG tablet TAKE ONE TABLET BY MOUTH ONCE DAILY 90 tablet 2   pravastatin (PRAVACHOL) 20 MG tablet Take 1 tablet (20 mg total) by mouth daily. 90 tablet 2   verapamil (CALAN-SR) 180 MG CR tablet Take 1 tablet (180 mg total) by mouth every morning. 90 tablet 2   No current facility-administered medications on file prior to visit.   Past Medical History:  Diagnosis Date   Acquired hypothyroidism    Anemia    Aortic atherosclerosis ([place])    Asthma    Dyslipidemia    Essential hypertension 07/05/2015   Hypothyroidism 06/25/2019   Mixed hyperlipidemia 06/25/2019   Osteoporosis    Stroke (cerebrum) (HCC)    Syncope    Vitamin B12 deficiency (non anemic)  06/17/2019   Past Surgical History:  Procedure Laterality Date   CARDIAC CATHETERIZATION     CHOLECYSTECTOMY      Family History  Problem Relation Age of Onset   Hypertension Mother    Hyperlipidemia Mother    Hypertension Father    Hyperlipidemia Father    Social History   Socioeconomic History   Marital status: Married    Spouse name: Not on file   Number of children: Not on file   Years of education: Not on file   Highest education level: Not on file  Occupational History   Not on file  Tobacco Use   Smoking status: Never   Smokeless tobacco: Never  Vaping Use   Vaping Use: Never used  Substance and Sexual Activity   Alcohol use: Never   Drug use: Never   Sexual activity: Not on file  Other Topics  Concern   Not on file  Social History Narrative   Not on file   Social Determinants of Health   Financial Resource Strain: Not on file  Food Insecurity: Not on file  Transportation Needs: Not on file  Physical Activity: Not on file  Stress: Not on file  Social Connections: Not on file    Objective:  BP (!) 148/78 (BP Location: Left Arm, Patient Position: Sitting)   Pulse 68   Temp (!) 97 F (36.1 C) (Temporal)   Ht 5' (1.524 m)   Wt 91 lb (41.3 kg)   SpO2 98%   BMI 17.77 kg/m      05/15/2022   10:30 AM 05/15/2022    8:55 AM 11/08/2021    8:59 AM  BP/Weight  Systolic BP 462 703 500  Diastolic BP 78 78 64  Wt. (Lbs)  91 89.4  BMI  17.77 kg/m2 17.46 kg/m2    Physical Exam Vitals reviewed.  Constitutional:      Appearance: Normal appearance. She is normal weight.  Cardiovascular:     Rate and Rhythm: Normal rate and regular rhythm.     Heart sounds: Normal heart sounds.  Pulmonary:     Effort: Pulmonary effort is normal.     Breath sounds: Normal breath sounds.  Abdominal:     General: Abdomen is flat. Bowel sounds are normal.     Palpations: Abdomen is soft.     Tenderness: There is no abdominal tenderness.  Neurological:     Mental Status: She is alert and oriented to person, place, and time.  Psychiatric:        Mood and Affect: Mood normal.        Behavior: Behavior normal.     Diabetic Foot Exam - Simple   No data filed      Lab Results  Component Value Date   WBC 5.6 11/08/2021   HGB 13.3 11/08/2021   HCT 39.7 11/08/2021   PLT 301 11/08/2021   GLUCOSE 87 11/08/2021   CHOL 173 11/08/2021   TRIG 116 11/08/2021   HDL 70 11/08/2021   LDLCALC 83 11/08/2021   ALT 10 11/08/2021   AST 19 11/08/2021   NA 145 (H) 11/08/2021   K 5.2 11/08/2021   CL 94 (L) 11/08/2021   CREATININE 1.04 (H) 11/08/2021   BUN 11 11/08/2021   CO2 25 11/08/2021   TSH 2.620 11/08/2021      Assessment & Plan:    Essential hypertension Assessment & Plan: Well  controlled.  No changes to medicines. Verapamil 180 mg every morning, aspirin 81 mg daily. Continue  to work on eating a healthy diet and exercise.  Labs drawn today.    Orders: -     Comprehensive metabolic panel -     CBC with Differential/Platelet  Other specified hypothyroidism Assessment & Plan: Previously well controlled Continue Synthroid at current dose  Recheck TSH and adjust Synthroid as indicated    Orders: -     TSH  Mixed hyperlipidemia Assessment & Plan: Well controlled.  No changes to medicines. Pravastatin 20 mg daily. Continue to work on eating a healthy diet and exercise.  Labs drawn today.    Orders: -     Lipid panel  Other osteoporosis without current pathological fracture Assessment & Plan: Discontinue Alendronate 70 mg weekly. Recommend evenity.  Sent for benefits verification.   Need for vaccination -     Pneumococcal conjugate vaccine 20-valent -     Flu Vaccine QUAD High Dose(Fluad)  Aortic atherosclerosis (HCC) Assessment & Plan: Continue pravastatin 20 mg one before bed and aspirin 81 mg daily.      Orders Placed This Encounter  Procedures   Pneumococcal conjugate vaccine 20-valent   Flu Vaccine QUAD High Dose(Fluad)   Comprehensive metabolic panel   Lipid panel   CBC with Differential/Platelet   TSH     Follow-up: Return in about 6 months (around 11/13/2022) for chronic fasting, awv.  An After Visit Summary was printed and given to the patient.  I,Lauren M Auman,acting as a scribe for Rochel Brome, MD.,have documented all relevant documentation on the behalf of Rochel Brome, MD,as directed by  Rochel Brome, MD while in the presence of Rochel Brome, MD.   I attest that I have reviewed this visit and agree with the plan scribed by my staff.  Rochel Brome, MD [facility] [phone]

## 2022-05-15 ENCOUNTER — Ambulatory Visit (INDEPENDENT_AMBULATORY_CARE_PROVIDER_SITE_OTHER): Payer: Medicare Other | Admitting: Family Medicine

## 2022-05-15 ENCOUNTER — Encounter: Payer: Self-pay | Admitting: Family Medicine

## 2022-05-15 VITALS — BP 148/78 | HR 68 | Temp 97.0°F | Ht 60.0 in | Wt 91.0 lb

## 2022-05-15 DIAGNOSIS — I10 Essential (primary) hypertension: Secondary | ICD-10-CM

## 2022-05-15 DIAGNOSIS — Z23 Encounter for immunization: Secondary | ICD-10-CM | POA: Diagnosis not present

## 2022-05-15 DIAGNOSIS — I700 Atherosclerosis of aorta: Secondary | ICD-10-CM | POA: Diagnosis not present

## 2022-05-15 DIAGNOSIS — E782 Mixed hyperlipidemia: Secondary | ICD-10-CM

## 2022-05-15 DIAGNOSIS — E038 Other specified hypothyroidism: Secondary | ICD-10-CM | POA: Diagnosis not present

## 2022-05-15 DIAGNOSIS — M818 Other osteoporosis without current pathological fracture: Secondary | ICD-10-CM

## 2022-05-15 NOTE — Assessment & Plan Note (Signed)
Continue pravastatin 20 mg one before bed and aspirin 81 mg daily.

## 2022-05-15 NOTE — Patient Instructions (Signed)
Recommend stop alendronate.   Recommend evenity. We will work on benefits verification.

## 2022-05-16 LAB — CBC WITH DIFFERENTIAL/PLATELET
Basophils Absolute: 0.1 10*3/uL (ref 0.0–0.2)
Basos: 1 %
EOS (ABSOLUTE): 0.1 10*3/uL (ref 0.0–0.4)
Eos: 1 %
Hematocrit: 40.7 % (ref 34.0–46.6)
Hemoglobin: 13.7 g/dL (ref 11.1–15.9)
Immature Grans (Abs): 0 10*3/uL (ref 0.0–0.1)
Immature Granulocytes: 0 %
Lymphocytes Absolute: 1.7 10*3/uL (ref 0.7–3.1)
Lymphs: 27 %
MCH: 30 pg (ref 26.6–33.0)
MCHC: 33.7 g/dL (ref 31.5–35.7)
MCV: 89 fL (ref 79–97)
Monocytes Absolute: 0.6 10*3/uL (ref 0.1–0.9)
Monocytes: 9 %
Neutrophils Absolute: 4 10*3/uL (ref 1.4–7.0)
Neutrophils: 62 %
Platelets: 393 10*3/uL (ref 150–450)
RBC: 4.56 x10E6/uL (ref 3.77–5.28)
RDW: 12.3 % (ref 11.7–15.4)
WBC: 6.4 10*3/uL (ref 3.4–10.8)

## 2022-05-16 LAB — COMPREHENSIVE METABOLIC PANEL
ALT: 17 IU/L (ref 0–32)
AST: 25 IU/L (ref 0–40)
Albumin/Globulin Ratio: 2.1 (ref 1.2–2.2)
Albumin: 4.9 g/dL (ref 3.9–4.9)
Alkaline Phosphatase: 69 IU/L (ref 44–121)
BUN/Creatinine Ratio: 11 — ABNORMAL LOW (ref 12–28)
BUN: 11 mg/dL (ref 8–27)
Bilirubin Total: 0.5 mg/dL (ref 0.0–1.2)
CO2: 22 mmol/L (ref 20–29)
Calcium: 10.1 mg/dL (ref 8.7–10.3)
Chloride: 101 mmol/L (ref 96–106)
Creatinine, Ser: 1.04 mg/dL — ABNORMAL HIGH (ref 0.57–1.00)
Globulin, Total: 2.3 g/dL (ref 1.5–4.5)
Glucose: 89 mg/dL (ref 70–99)
Potassium: 5.3 mmol/L — ABNORMAL HIGH (ref 3.5–5.2)
Sodium: 139 mmol/L (ref 134–144)
Total Protein: 7.2 g/dL (ref 6.0–8.5)
eGFR: 58 mL/min/{1.73_m2} — ABNORMAL LOW (ref >59)

## 2022-05-16 LAB — CARDIOVASCULAR RISK ASSESSMENT

## 2022-05-16 LAB — LIPID PANEL
Chol/HDL Ratio: 2 ratio (ref 0.0–4.4)
Cholesterol, Total: 153 mg/dL (ref 100–199)
HDL: 75 mg/dL (ref >39)
LDL Chol Calc (NIH): 62 mg/dL (ref 0–99)
Triglycerides: 88 mg/dL (ref 0–149)
VLDL Cholesterol Cal: 16 mg/dL (ref 5–40)

## 2022-05-16 LAB — TSH: TSH: 2.27 u[IU]/mL (ref 0.450–4.500)

## 2022-05-16 NOTE — Progress Notes (Signed)
Blood count normal.  Liver function normal.  Kidney function abnormal, but stable Stage 3a..  No nsaids.  Thyroid function normal.  Cholesterol: good HBA1C:

## 2022-05-29 ENCOUNTER — Ambulatory Visit: Payer: Medicare Other

## 2022-05-29 VITALS — BP 119/63 | HR 65

## 2022-05-29 DIAGNOSIS — I10 Essential (primary) hypertension: Secondary | ICD-10-CM

## 2022-05-29 NOTE — Progress Notes (Signed)
   Blood Pressure Recheck Visit  Name: Barbara Duncan MRN: [id number] Date of Birth: 29-Dec-1950  [name] presents today for Blood Pressure recheck with clinical support staff.  Order for BP recheck by Dr Tobie Poet, ordered on 05/15/22.   BP Readings from Last 3 Encounters:  05/29/22 119/63  05/15/22 (!) 148/78  11/08/21 120/64    Current Outpatient Medications  Medication Sig Dispense Refill   aspirin EC 81 MG tablet Take 1 tablet (81 mg total) by mouth daily. 90 tablet 3   Cholecalciferol (VITAMIN D3) 1.25 MG (50000 UT) TABS Take by mouth.     levothyroxine (SYNTHROID) 25 MCG tablet TAKE ONE TABLET BY MOUTH EVERY DAY 90 tablet 2   pantoprazole (PROTONIX) 40 MG tablet TAKE ONE TABLET BY MOUTH ONCE DAILY 90 tablet 2   pravastatin (PRAVACHOL) 20 MG tablet Take 1 tablet (20 mg total) by mouth daily. 90 tablet 2   verapamil (CALAN-SR) 180 MG CR tablet Take 1 tablet (180 mg total) by mouth every morning. 90 tablet 2   No current facility-administered medications for this visit.    Hypertensive Medication Review: Patient states that they are taking all their hypertensive medications as prescribed and their last dose of hypertensive medications was this morning   Documentation of any medication adherence discrepancies: N/A  Patient will contact the office for any future questions or concerns.

## 2022-06-07 DIAGNOSIS — Z124 Encounter for screening for malignant neoplasm of cervix: Secondary | ICD-10-CM | POA: Diagnosis not present

## 2022-06-07 LAB — HM PAP SMEAR: HM Pap smear: NEGATIVE

## 2022-06-14 ENCOUNTER — Telehealth: Payer: Self-pay

## 2022-06-14 ENCOUNTER — Ambulatory Visit (INDEPENDENT_AMBULATORY_CARE_PROVIDER_SITE_OTHER): Payer: Medicare Other

## 2022-06-14 DIAGNOSIS — M818 Other osteoporosis without current pathological fracture: Secondary | ICD-10-CM

## 2022-06-14 MED ORDER — ROMOSOZUMAB-AQQG 105 MG/1.17ML ~~LOC~~ SOSY
210.0000 mg | PREFILLED_SYRINGE | SUBCUTANEOUS | Status: DC
  Administered 2022-06-14 – 2022-09-18 (×4): 210 mg via SUBCUTANEOUS

## 2022-06-14 NOTE — Telephone Encounter (Signed)
Contacted Barbara Duncan to schedule their annual wellness visit. Appointment made for 06/22/22.  Norton Blizzard, [place] ([facility])  [place] [facility] [phone]

## 2022-06-14 NOTE — Progress Notes (Signed)
   Evenity injection given per order, patient tolerated well. This is her initial dose.  Erie Noe, LPN [id number] [phone]

## 2022-06-20 ENCOUNTER — Other Ambulatory Visit: Payer: Self-pay

## 2022-06-20 MED ORDER — PRAVASTATIN SODIUM 20 MG PO TABS: 20.0000 mg | ORAL_TABLET | Freq: Every day | ORAL | 1 refills | Status: DC

## 2022-06-22 ENCOUNTER — Ambulatory Visit (INDEPENDENT_AMBULATORY_CARE_PROVIDER_SITE_OTHER): Payer: Medicare Other

## 2022-06-22 VITALS — BP 127/80 | HR 64 | Resp 12 | Ht 60.0 in | Wt 94.0 lb

## 2022-06-22 DIAGNOSIS — Z0000 Encounter for general adult medical examination without abnormal findings: Secondary | ICD-10-CM

## 2022-06-22 NOTE — Patient Instructions (Signed)

## 2022-06-22 NOTE — Progress Notes (Signed)
Subjective:   Barbara Duncan is a 72 y.o. female who presents for an Initial Medicare Annual Wellness Visit.  I connected with  Ritta Slot on 06/22/22 by phone enabled telemedicine application and verified that I am speaking with the correct person using two identifiers.   I discussed the limitations of evaluation and management by telemedicine. The patient expressed understanding and agreed to proceed.   Cardiac Risk Factors include: advanced age (>28mn, >>77women);sedentary lifestyle     Objective:    Today's Vitals   06/22/22 0919  BP: 127/80  Pulse: 64  Resp: 12  Weight: 94 lb (42.6 kg)  Height: 5' (1.524 m)   Body mass index is 18.36 kg/m.     06/22/2022    9:25 AM  Advanced Directives  Does Patient Have a Medical Advance Directive? No  Would patient like information on creating a medical advance directive? Yes (ED - Information included in AVS)    Current Medications (verified) Outpatient Encounter Medications as of 06/22/2022  Medication Sig   aspirin EC 81 MG tablet Take 1 tablet (81 mg total) by mouth daily.   Calcium Carb-Cholecalciferol (CALCIUM 600 + D PO) Take by mouth.   levothyroxine (SYNTHROID) 25 MCG tablet TAKE ONE TABLET BY MOUTH EVERY DAY   pantoprazole (PROTONIX) 40 MG tablet TAKE ONE TABLET BY MOUTH ONCE DAILY   pravastatin (PRAVACHOL) 20 MG tablet Take 1 tablet (20 mg total) by mouth daily.   verapamil (CALAN-SR) 180 MG CR tablet Take 1 tablet (180 mg total) by mouth every morning.   [DISCONTINUED] Cholecalciferol (VITAMIN D3) 1.25 MG (50000 UT) TABS Take by mouth.   Facility-Administered Encounter Medications as of 06/22/2022  Medication   Romosozumab-aqqg (EVENITY) 105 MG/1.17ML injection 210 mg    Allergies (verified) Patient has no known allergies.   History: Past Medical History:  Diagnosis Date   Acquired hypothyroidism    Anemia    [place]ortic atherosclerosis [place]    Asthma    Dyslipidemia    Essential hypertension 07/05/2015    Hypothyroidism 06/25/2019   Mixed hyperlipidemia 06/25/2019   Osteoporosis    Stroke (cerebrum) (HCC)    Syncope    Vitamin B12 deficiency (non anemic) 06/17/2019   Past Surgical History:  Procedure Laterality Date   CARDIAC CATHETERIZATION     CHOLECYSTECTOMY     Family History  Problem Relation Age of Onset   Hypertension Mother    Hyperlipidemia Mother    Hypertension Father    Hyperlipidemia Father    Social History   Socioeconomic History   Marital status: Married    Spouse name: Not on file   Number of children: Not on file   Years of education: Not on file   Highest education level: Not on file  Occupational History   Not on file  Tobacco Use   Smoking status: Never   Smokeless tobacco: Never  Vaping Use   Vaping Use: Never used  Substance and Sexual Activity   Alcohol use: Never   Drug use: Never   Sexual activity: Not Currently  Other Topics Concern   Not on file  Social History Narrative   Not on file   Social Determinants of Health   Financial Resource Strain: Low Risk  (06/22/2022)   Overall Financial Resource Strain (CARDIA)    Difficulty of Paying Living Expenses: Not hard at all  Food Insecurity: No Food Insecurity (06/22/2022)   Hunger Vital Sign    Worried About Running Out of Food in the Last  Year: Never true    [place] in the Last Year: Never true  Transportation Needs: No Transportation Needs (06/22/2022)   PRAPARE - Hydrologist (Medical): No    Lack of Transportation (Non-Medical): No  Physical Activity: Inactive (06/22/2022)   Exercise Vital Sign    Days of Exercise per Week: 0 days    Minutes of Exercise per Session: 0 min  Stress: Stress Concern Present (06/22/2022)   [place]    Feeling of Stress : Rather much  Social Connections: Socially Integrated (06/22/2022)   Social Connection and Isolation Panel [NHANES]    Frequency of  Communication with Friends and Family: More than three times a week    Frequency of Social Gatherings with Friends and Family: Twice a week    Attends Religious Services: More than 4 times per year    Active Member of [employer] or Organizations: Yes    Attends Music therapist: More than 4 times per year    Marital Status: Married    Tobacco Counseling Counseling given: Not Answered   Clinical Intake:  Pre-visit preparation completed: Yes  Pain : No/denies pain     BMI - recorded: 18.36 Nutritional Status: BMI of 19-24  Normal Nutritional Risks: None Diabetes: No  How often do you need to have someone help you when you read instructions, pamphlets, or other written materials from your doctor or pharmacy?: 1 - Never What is the last grade level you completed in school?: Some college  Diabetic?no  Interpreter Needed?: No      Activities of Daily Living    06/22/2022    9:26 AM  In your present state of health, do you have any difficulty performing the following activities:  Hearing? 0  Vision? 0  Comment reading glasses.  Difficulty concentrating or making decisions? 0  Walking or climbing stairs? 0  Dressing or bathing? 0  Doing errands, shopping? 0  Preparing Food and eating ? N  Using the Toilet? N  In the past six months, have you accidently leaked urine? N  Do you have problems with loss of bowel control? N  Managing your Medications? N  Managing your Finances? N  Housekeeping or managing your Housekeeping? N    Patient Care Team: CoxElnita Maxwell, MD as PCP - General (Family Medicine) Norva Karvonen as Referring Physician (Obstetrics and Gynecology)  Indicate any recent Medical Services you may have received from other than [facility] providers in the past year (date may be approximate).     Assessment:   This is a routine wellness examination for [facility].  Hearing/Vision screen No results found.  Dietary issues and exercise activities  discussed: Current Exercise Habits: The patient does not participate in regular exercise at present, Exercise limited by: None identified   Goals Addressed             This Visit's Progress    Activity and Exercise Increased       Evidence-based guidance:  Review current exercise levels.  Assess child and caregiver perspective on exercise or activity level, barriers to increasing activity, motivation and readiness for change.  Recommend or set healthy exercise goal based on individual tolerance.  Encourage small steps toward making change in amount of exercise or activity.  Urge reduction of sedentary activities and screen time of greater rhan 2 hours per day.  Promote group activities within the community or with family or support person.  Notes:       Depression Screen    06/22/2022    9:36 AM 05/15/2022    8:57 AM 11/08/2021    9:03 AM 11/01/2020    9:53 AM 10/27/2019    9:03 AM  PHQ 2/9 Scores  PHQ - 2 Score 0 0 0 0 0    Fall Risk    06/22/2022    9:25 AM 05/15/2022    8:57 AM 11/08/2021    9:04 AM 05/09/2021   10:00 AM  Fall Risk   Falls in the past year? 0 0 0 0  Number falls in past yr: 0 0 0 0  Injury with Fall? 0 0 0 0  Risk for fall due to : No Fall Risks No Fall Risks No Fall Risks   Follow up Education provided Falls evaluation completed Education provided Falls evaluation completed    [place]:  Any stairs in or around the home? Yes  If so, are there any without handrails? Yes  Home free of loose throw rugs in walkways, pet beds, electrical cords, etc? Yes  Adequate lighting in your home to reduce risk of falls? Yes   ASSISTIVE DEVICES UTILIZED TO PREVENT FALLS:  Life alert? No  Use of a cane, walker or w/c? No  Grab bars in the bathroom? Yes  Shower chair or bench in shower? Yes  Elevated toilet seat or a handicapped toilet? Yes   TIMED UP AND GO:  Was the test performed? No .   Cognitive Function:         06/22/2022    9:32 AM  6CIT Screen  What Year? 0 points  What month? 0 points  What time? 0 points  Count back from 20 0 points  Months in reverse 0 points  Repeat phrase 0 points  Total Score 0 points    Immunizations Immunization History  Administered Date(s) Administered   Fluad Quad(high Dose 65+) 03/23/2020, 05/09/2021, 05/15/2022   Moderna Covid-19 Vaccine Bivalent Booster 12yr & up 05/09/2021   Moderna SARS-COV2 Booster Vaccination 03/23/2020, 09/13/2020   Moderna Sars-Covid-2 Vaccination 05/15/2019, 06/10/2019   PNEUMOCOCCAL CONJUGATE-20 05/15/2022   Pneumococcal Polysaccharide-23 01/15/2018   Tdap 01/22/2018   Zoster Recombinat (Shingrix) 11/09/2021, 01/09/2022    TDAP status: Up to date  Flu Vaccine status: Up to date  Pneumococcal vaccine status: Up to date  Covid-19 vaccine status: Completed vaccines  Qualifies for Shingles Vaccine? Yes   Zostavax completed No   Shingrix Completed?: Yes  Screening Tests Health Maintenance  Topic Date Due   COVID-19 Vaccine (6 - 2023-24 season) 07/08/2022 (Originally 12/09/2021)   COLONOSCOPY (Pts 45-459yrInsurance coverage will need to be confirmed)  12/26/2022   Medicare Annual Wellness (AWV)  06/22/2023   MAMMOGRAM  11/08/2023   PAP SMEAR-Modifier  06/07/2024   DTaP/Tdap/Td (2 - Td or Tdap) 01/23/2028   Pneumonia Vaccine 6533Years old  Completed   INFLUENZA VACCINE  Completed   DEXA SCAN  Completed   Hepatitis C Screening  Completed   Zoster Vaccines- Shingrix  Completed   HPV VACCINES  Aged Out    Health Maintenance  There are no preventive care reminders to display for this patient.   Colorectal cancer screening: Type of screening: Colonoscopy. Completed 12/26/2022. Repeat every 10 years  Mammogram status: Ordered 06/07/2022. Pt provided with contact info and advised to call to schedule appt.  It was scheduled for Gynecologist. Last mammogram has to be after 11/08/2022.  Bone Density  status: Completed  05/23/2021. Results reflect: Bone density results: OSTEOPOROSIS. Repeat every 3 years.  Lung Cancer Screening: (Low Dose CT Chest recommended if Age 24-80 years, 30 pack-year currently smoking OR have quit w/in 15years.) does not qualify.   Lung Cancer Screening Referral: none  Additional Screening:  Hepatitis C Screening: does qualify; Completed 11/08/2021.  Vision Screening: Recommended annual ophthalmology exams for early detection of glaucoma and other disorders of the eye. Is the patient up to date with their annual eye exam?  Yes  Who is the provider or what is the name of the office in which the patient attends annual eye exams? N/a If pt is not established with a provider, would they like to be referred to a provider to establish care? No .   Dental Screening: Recommended annual dental exams for proper oral hygiene  Community Resource Referral / Chronic Care Management: CRR required this visit?  No   CCM required this visit?  No      Plan:     I have personally reviewed and noted the following in the patient's chart:   Medical and social history Use of alcohol, tobacco or illicit drugs  Current medications and supplements including opioid prescriptions. Patient is not currently taking opioid prescriptions. Functional ability and status Nutritional status Physical activity Advanced directives List of other physicians Hospitalizations, surgeries, and ER visits in previous 12 months Vitals Screenings to include cognitive, depression, and falls Referrals and appointments  In addition, I have reviewed and discussed with patient certain preventive protocols, quality metrics, and best practice recommendations. A written personalized care plan for preventive services as well as general preventive health recommendations were provided to patient.     Augustin Coupe, [place]   06/22/2022   Nurse Notes: Patient's goals regarding to her health is to continue to feel healthy  and exercise more. She wants to continue to be independent and active at her home. She is up to date on her vaccines, mammogram, colonoscopy and Dexa scan. She sees gynecologist and order next mammogram and do pap smear which it was normal. Next mammogram will be after 11/08/2022  I spent 40 minutes by phone with patient.

## 2022-06-27 ENCOUNTER — Other Ambulatory Visit: Payer: Self-pay

## 2022-06-27 ENCOUNTER — Other Ambulatory Visit (HOSPITAL_COMMUNITY): Payer: Self-pay

## 2022-06-27 DIAGNOSIS — M818 Other osteoporosis without current pathological fracture: Secondary | ICD-10-CM

## 2022-06-27 MED ORDER — EVENITY 105 MG/1.17ML ~~LOC~~ SOSY
210.0000 mg | PREFILLED_SYRINGE | Freq: Once | SUBCUTANEOUS | 0 refills | Status: DC
  Filled 2022-06-27: qty 3.51, 1d supply, fill #0

## 2022-06-28 ENCOUNTER — Other Ambulatory Visit (HOSPITAL_COMMUNITY): Payer: Self-pay

## 2022-06-29 ENCOUNTER — Other Ambulatory Visit (HOSPITAL_COMMUNITY): Payer: Self-pay

## 2022-07-03 ENCOUNTER — Other Ambulatory Visit: Payer: Self-pay

## 2022-07-03 MED ORDER — VERAPAMIL HCL ER 180 MG PO TBCR: 180.0000 mg | EXTENDED_RELEASE_TABLET | Freq: Every morning | ORAL | 1 refills | Status: DC

## 2022-07-17 ENCOUNTER — Ambulatory Visit (INDEPENDENT_AMBULATORY_CARE_PROVIDER_SITE_OTHER): Payer: Medicare Other

## 2022-07-17 DIAGNOSIS — M818 Other osteoporosis without current pathological fracture: Secondary | ICD-10-CM

## 2022-07-17 NOTE — Progress Notes (Signed)
   Evenity injection given per order, patient tolerated well. Barbara Duncan denies any reactions from her initial dose last month.  Jacklynn Bue, LPN 82:42 AM

## 2022-08-01 ENCOUNTER — Telehealth: Payer: Self-pay

## 2022-08-01 NOTE — Telephone Encounter (Signed)
Patient started with her initial Evenity injection June 14, 2022.  She denied any problems after that injection.  Her second injection was April 8 and she called today to report that soon after that she started having abdominal cramping, mostly on the left side as well as "loose stool but not diarrhea".  She denies any nausea or vomiting.  She wants to know if it could be coming from her Evenity and should she take the next dose?

## 2022-08-02 NOTE — Telephone Encounter (Signed)
Patient informed and scheduled.

## 2022-08-03 ENCOUNTER — Encounter: Payer: Self-pay | Admitting: Family Medicine

## 2022-08-03 ENCOUNTER — Ambulatory Visit (INDEPENDENT_AMBULATORY_CARE_PROVIDER_SITE_OTHER): Payer: Medicare Other | Admitting: Family Medicine

## 2022-08-03 VITALS — BP 130/62 | HR 79 | Temp 96.9°F | Ht 60.0 in | Wt 90.0 lb

## 2022-08-03 DIAGNOSIS — K5904 Chronic idiopathic constipation: Secondary | ICD-10-CM | POA: Diagnosis not present

## 2022-08-03 HISTORY — DX: Chronic idiopathic constipation: K59.04

## 2022-08-03 NOTE — Assessment & Plan Note (Signed)
Miralax BID if no improvement will call our office.

## 2022-08-03 NOTE — Patient Instructions (Signed)
Take miralax twice a day

## 2022-08-03 NOTE — Progress Notes (Signed)
Acute Office Visit  Subjective:    Patient ID: Barbara Duncan, female    DOB: [birth year], 72 y.o.   MRN: [id number]  Chief Complaint  Patient presents with   Abdominal Pain    HPI: Patient is in today for abdominal pain x 2 weeks having difficulty using the restroom, painful to sit, has constant pain across lower abdomin. Tried taking antidiarrhea x 2-3 days which helped some. Last BM was Sunday or Monday has been drinking water and walking.  Past Medical History:  Diagnosis Date   Acquired hypothyroidism    Anemia    Aortic atherosclerosis (HCC)    Asthma    Dyslipidemia    Essential hypertension 07/05/2015   Hypothyroidism 06/25/2019   Mixed hyperlipidemia 06/25/2019   Osteoporosis    Stroke (cerebrum) (HCC)    Syncope    Vitamin B12 deficiency (non anemic) 06/17/2019    Past Surgical History:  Procedure Laterality Date   CARDIAC CATHETERIZATION     CHOLECYSTECTOMY      Family History  Problem Relation Age of Onset   Hypertension Mother    Hyperlipidemia Mother    Hypertension Father    Hyperlipidemia Father     Social History   Socioeconomic History   Marital status: Married    Spouse name: Not on file   Number of children: Not on file   Years of education: Not on file   Highest education level: Not on file  Occupational History   Not on file  Tobacco Use   Smoking status: Never   Smokeless tobacco: Never  Vaping Use   Vaping Use: Never used  Substance and Sexual Activity   Alcohol use: Never   Drug use: Never   Sexual activity: Not Currently  Other Topics Concern   Not on file  Social History Narrative   Not on file   Social Determinants of Health   Financial Resource Strain: Low Risk  (06/22/2022)   Overall Financial Resource Strain (CARDIA)    Difficulty of Paying Living Expenses: Not hard at all  Food Insecurity: No Food Insecurity (06/22/2022)   Hunger Vital Sign    Worried About Running Out of Food in the Last Year: Never true    Ran Out  of Food in the Last Year: Never true  Transportation Needs: No Transportation Needs (06/22/2022)   PRAPARE - Administrator, Civil Service (Medical): No    Lack of Transportation (Non-Medical): No  Physical Activity: Inactive (06/22/2022)   Exercise Vital Sign    Days of Exercise per Week: 0 days    Minutes of Exercise per Session: 0 min  Stress: Stress Concern Present (06/22/2022)   [employer] of Occupational Health - Occupational Stress Questionnaire    Feeling of Stress : Rather much  Social Connections: Socially Integrated (06/22/2022)   Social Connection and Isolation Panel [NHANES]    Frequency of Communication with Friends and Family: More than three times a week    Frequency of Social Gatherings with Friends and Family: Twice a week    Attends Religious Services: More than 4 times per year    Active Member of [employer] or Organizations: Yes    Attends Banker Meetings: More than 4 times per year    Marital Status: Married  Catering manager Violence: Not At Risk (06/22/2022)   Humiliation, Afraid, Rape, and Kick questionnaire    Fear of Current or Ex-Partner: No    Emotionally Abused: No    Physically Abused: No  Sexually Abused: No    Outpatient Medications Prior to Visit  Medication Sig Dispense Refill   aspirin EC 81 MG tablet Take 1 tablet (81 mg total) by mouth daily. 90 tablet 3   Calcium Carb-Cholecalciferol (CALCIUM 600 + D PO) Take by mouth.     levothyroxine (SYNTHROID) 25 MCG tablet TAKE ONE TABLET BY MOUTH EVERY DAY 90 tablet 2   pantoprazole (PROTONIX) 40 MG tablet TAKE ONE TABLET BY MOUTH ONCE DAILY 90 tablet 2   pravastatin (PRAVACHOL) 20 MG tablet Take 1 tablet (20 mg total) by mouth daily. 90 tablet 1   verapamil (CALAN-SR) 180 MG CR tablet Take 1 tablet (180 mg total) by mouth every morning. 90 tablet 1   Facility-Administered Medications Prior to Visit  Medication Dose Route Frequency Provider Last Rate Last Admin    Romosozumab-aqqg (EVENITY) 105 MG/1. injection 210 mg  210 mg Subcutaneous Q30 days Blane Ohara, MD   210 mg at 07/17/22 1034    No Known Allergies  Review of Systems  Constitutional:  Negative for chills and fever.  Respiratory:  Negative for cough and shortness of breath.   Cardiovascular:  Negative for chest pain.  Gastrointestinal:  Positive for constipation, diarrhea and nausea.       Objective:        08/03/2022    9:35 AM 06/22/2022    9:19 AM 05/29/2022   10:27 AM  Vitals with BMI  Height 5\' 0"  5\' 0"    Weight 90 lbs 94 lbs   BMI 17.58 18.36   Systolic 130 127 161  Diastolic 62 80 63  Pulse 79 64 65    No data found.   Physical Exam Vitals reviewed.  Constitutional:      Appearance: Normal appearance. She is normal weight.  Neck:     Vascular: No carotid bruit.  Cardiovascular:     Rate and Rhythm: Normal rate and regular rhythm.     Heart sounds: Normal heart sounds.  Pulmonary:     Effort: Pulmonary effort is normal. No respiratory distress.     Breath sounds: Normal breath sounds.  Abdominal:     General: Abdomen is flat. Bowel sounds are normal.     Palpations: Abdomen is soft.     Tenderness: There is no abdominal tenderness.     Comments: Mile abdominal discomfort.  Neurological:     Mental Status: She is alert and oriented to person, place, and time.  Psychiatric:        Mood and Affect: Mood normal.        Behavior: Behavior normal.    Health Maintenance Due  Topic Date Due   COVID-19 Vaccine (6 - 2023-24 season) 12/09/2021    There are no preventive care reminders to display for this patient.   Lab Results  Component Value Date   TSH 2.270 05/15/2022   Lab Results  Component Value Date   WBC 6.4 05/15/2022   HGB 13.7 05/15/2022   HCT 40.7 05/15/2022   MCV 89 05/15/2022   PLT 393 05/15/2022   Lab Results  Component Value Date   NA 139 05/15/2022   K 5.3 (H) 05/15/2022   CO2 22 05/15/2022   GLUCOSE 89 05/15/2022   BUN  11 05/15/2022   CREATININE 1.04 (H) 05/15/2022   BILITOT 0.5 05/15/2022   ALKPHOS 69 05/15/2022   AST 25 05/15/2022   ALT 17 05/15/2022   PROT 7.2 05/15/2022   ALBUMIN 4.9 05/15/2022   CALCIUM 10.1 05/15/2022  EGFR 58 (L) 05/15/2022   Lab Results  Component Value Date   CHOL 153 05/15/2022   Lab Results  Component Value Date   HDL 75 05/15/2022   Lab Results  Component Value Date   LDLCALC 62 05/15/2022   Lab Results  Component Value Date   TRIG 88 05/15/2022   Lab Results  Component Value Date   CHOLHDL 2.0 05/15/2022   No results found for: "HGBA1C"     Assessment & Plan:  Chronic idiopathic constipation Assessment & Plan: Miralax BID if no improvement will call our office.    Follow-up: Return if symptoms worsen or fail to improve.  An After Visit Summary was printed and given to the patient.  Blane Ohara, MD [facility] [phone]

## 2022-08-07 ENCOUNTER — Telehealth: Payer: Self-pay

## 2022-08-07 DIAGNOSIS — R1084 Generalized abdominal pain: Secondary | ICD-10-CM | POA: Diagnosis not present

## 2022-08-07 DIAGNOSIS — R109 Unspecified abdominal pain: Secondary | ICD-10-CM | POA: Diagnosis not present

## 2022-08-07 DIAGNOSIS — I10 Essential (primary) hypertension: Secondary | ICD-10-CM | POA: Diagnosis not present

## 2022-08-07 DIAGNOSIS — E7800 Pure hypercholesterolemia, unspecified: Secondary | ICD-10-CM | POA: Diagnosis not present

## 2022-08-07 DIAGNOSIS — K6389 Other specified diseases of intestine: Secondary | ICD-10-CM | POA: Diagnosis not present

## 2022-08-07 DIAGNOSIS — K5900 Constipation, unspecified: Secondary | ICD-10-CM | POA: Diagnosis not present

## 2022-08-07 NOTE — Telephone Encounter (Signed)
Called patient back to inform her per Dr. Sedalia Muta she would need to go on to the ER to be evaluated considering her pain level was 10 and abdominal pain was worse. Patient informed and agreed she would go to the ER to be evaluated.

## 2022-08-07 NOTE — Telephone Encounter (Signed)
Patient called stating that her Abdominal pain has gotten worse she states that now it is continuous and asked patient on a pain scale of 1-10 what her pain level was and she states a 10. She states that she has had two bowel movements since starting the Miralax twice daily. Please advise.

## 2022-08-08 ENCOUNTER — Telehealth: Payer: Self-pay

## 2022-08-08 NOTE — Telephone Encounter (Signed)
Patient called stating that she went to the hospital yesterday as instructed, and she is needing to schedule a 1 week hospital follow up with you. Please advise where to schedule patient because she is still stating that she is having diarrhea too as well.

## 2022-08-08 NOTE — Telephone Encounter (Signed)
Patient informed and scheduled.

## 2022-08-10 DIAGNOSIS — K219 Gastro-esophageal reflux disease without esophagitis: Secondary | ICD-10-CM | POA: Diagnosis not present

## 2022-08-10 DIAGNOSIS — K3189 Other diseases of stomach and duodenum: Secondary | ICD-10-CM | POA: Diagnosis not present

## 2022-08-10 DIAGNOSIS — K5900 Constipation, unspecified: Secondary | ICD-10-CM | POA: Diagnosis not present

## 2022-08-10 DIAGNOSIS — K449 Diaphragmatic hernia without obstruction or gangrene: Secondary | ICD-10-CM | POA: Diagnosis not present

## 2022-08-10 DIAGNOSIS — R109 Unspecified abdominal pain: Secondary | ICD-10-CM | POA: Diagnosis not present

## 2022-08-10 DIAGNOSIS — I10 Essential (primary) hypertension: Secondary | ICD-10-CM | POA: Diagnosis not present

## 2022-08-10 LAB — LAB REPORT - SCANNED: EGFR: 60

## 2022-08-15 ENCOUNTER — Telehealth: Payer: Self-pay

## 2022-08-15 NOTE — Telephone Encounter (Signed)
Transition Care Management Follow-up Telephone Call Date of discharge and from where: [facility] 4/29 How have you been since you were released from the hospital? Doing better Any questions or concerns? No  Items Reviewed: Did the pt receive and understand the discharge instructions provided? Yes  Medications obtained and verified? Yes  Other? No  Any new allergies since your discharge? No  Dietary orders reviewed? No Do you have support at home? Yes     Follow up appointments reviewed:  PCP Hospital f/u appt confirmed? Yes  Scheduled to see PCP 5/16 on  @ . Specialist Hospital f/u appt confirmed? No  Scheduled to see  on  @ . Are transportation arrangements needed? No  If their condition worsens, is the pt aware to call PCP or go to the Emergency Dept.? Yes Was the patient provided with contact information for the PCP's office or ED? Yes Was to pt encouraged to call back with questions or concerns? Yes

## 2022-08-15 NOTE — Telephone Encounter (Signed)
Transition Care Management Unsuccessful Follow-up Telephone Call  Date of discharge and from where:  [facility] 4/29  Attempts:  1st Attempt  Reason for unsuccessful TCM follow-up call:  Left voice message   Lenard Forth [facility] Guide, [facility] [phone] [address] [place], [place], [place] [address] Phone: [phone] Email: [email or web address]

## 2022-08-17 ENCOUNTER — Encounter: Payer: Self-pay | Admitting: Physician Assistant

## 2022-08-17 ENCOUNTER — Ambulatory Visit (INDEPENDENT_AMBULATORY_CARE_PROVIDER_SITE_OTHER): Payer: Medicare Other | Admitting: Physician Assistant

## 2022-08-17 ENCOUNTER — Ambulatory Visit: Payer: Medicare Other

## 2022-08-17 VITALS — BP 122/70 | HR 76 | Temp 98.4°F | Resp 12 | Ht 60.0 in | Wt 88.0 lb

## 2022-08-17 DIAGNOSIS — R8290 Unspecified abnormal findings in urine: Secondary | ICD-10-CM

## 2022-08-17 DIAGNOSIS — M818 Other osteoporosis without current pathological fracture: Secondary | ICD-10-CM

## 2022-08-17 DIAGNOSIS — K5904 Chronic idiopathic constipation: Secondary | ICD-10-CM

## 2022-08-17 LAB — POCT URINALYSIS DIP (CLINITEK)
Bilirubin, UA: NEGATIVE
Blood, UA: NEGATIVE
Glucose, UA: NEGATIVE mg/dL
Ketones, POC UA: NEGATIVE mg/dL
Leukocytes, UA: NEGATIVE
Nitrite, UA: NEGATIVE
POC PROTEIN,UA: NEGATIVE
Spec Grav, UA: 1.02 (ref 1.010–1.025)
Urobilinogen, UA: 0.2 E.U./dL
pH, UA: 6 (ref 5.0–8.0)

## 2022-08-17 NOTE — Assessment & Plan Note (Addendum)
Uncontroled, but now improving since visit to the ED Continue to work on eating a healthy diet and exercise.    No major side effects reported, and no issues with compliance. The current medical regimen is effective;  continue present plan with Miralax 17g and Dulcolax 5mg 

## 2022-08-17 NOTE — Progress Notes (Unsigned)
Subjective:  Patient ID: Barbara Duncan, female    DOB: 1951-02-19  Age: 72 y.o. MRN: [id number]  Chief Complaint  Patient presents with   Constipation    HPI  Patient was seen at ED on 08/07/2022 and 08/10/2022 for constipation. They did CT abdomen and pelvis and it showed constipation with large bowel distention. They did an enema on 08/07/2022. However, she went back to the ED on 08/10/2022 for nausea, vomiting and abdominal pain.  They increased Miralax to twice per day and added Dulcolax 2 tablets daily until her bowels start to move. They recommended follow up with PCP in one week.  Patient states that the pain has now been gone and she is back to a normal BM of 1 to 2 a day with no discoloration or blood. Discussed with patient on drinking more water after she said she drinks about 8 oz a day. Patient states she eats brocolli regularly and raisin bran.  Patient does admit to having increased stress in her live with managing her sister and husband. Her husband is in stage 4 kidney failure and her sister is in a nursing home. Patient denies any suicidal ideations or panic attacks. Discussed using anti anxiety medication if she begins to be more anxious. Patient said she will let us know if she is concerned or wants to start some medication.      08/17/2022    1:44 PM 06/22/2022    9:36 AM 05/15/2022    8:57 AM 11/08/2021    9:03 AM 11/01/2020    9:53 AM  Depression screen PHQ 2/9  Decreased Interest 0 0 0 0 0  Down, Depressed, Hopeless 0 0 0 0 0  PHQ - 2 Score 0 0 0 0 0  Altered sleeping 0      Tired, decreased energy 0      Change in appetite 0      Feeling bad or failure about yourself  0      Trouble concentrating 0      Moving slowly or fidgety/restless 0      Suicidal thoughts 0      PHQ-9 Score 0      Difficult doing work/chores Not difficult at all            08/17/2022    1:43 PM  Fall Risk   Falls in the past year? 0  Number falls in past yr: 0  Injury with Fall? 0   Risk for fall due to : No Fall Risks  Follow up Falls prevention discussed    Patient Care Team: Blane Ohara, MD as PCP - General (Family Medicine) Illene Bolus as Referring Physician (Obstetrics and Gynecology)   Review of Systems  Constitutional:  Negative for chills, fatigue and fever.  HENT:  Negative for congestion, ear pain and sore throat.   Respiratory:  Negative for cough and shortness of breath.   Cardiovascular:  Negative for chest pain and palpitations.  Gastrointestinal:  Negative for abdominal pain, constipation, diarrhea, nausea and vomiting.  Endocrine: Negative for polydipsia, polyphagia and polyuria.  Genitourinary:  Negative for difficulty urinating and dysuria.  Musculoskeletal:  Negative for arthralgias, back pain and myalgias.  Skin:  Negative for rash.  Neurological:  Negative for headaches.  Psychiatric/Behavioral:  Negative for dysphoric mood. The patient is not nervous/anxious.     Current Outpatient Medications on File Prior to Visit  Medication Sig Dispense Refill   aspirin EC 81 MG tablet Take 1 tablet (81 mg  total) by mouth daily. 90 tablet 3   bisacodyl (DULCOLAX) 5 MG EC tablet Take 5 mg by mouth daily as needed for moderate constipation.     Calcium Carb-Cholecalciferol (CALCIUM 600 + D PO) Take by mouth.     levothyroxine (SYNTHROID) 25 MCG tablet TAKE ONE TABLET BY MOUTH EVERY DAY 90 tablet 2   pantoprazole (PROTONIX) 40 MG tablet TAKE ONE TABLET BY MOUTH ONCE DAILY 90 tablet 2   polyethylene glycol (MIRALAX / GLYCOLAX) 17 g packet Take 17 g by mouth daily.     pravastatin (PRAVACHOL) 20 MG tablet Take 1 tablet (20 mg total) by mouth daily. 90 tablet 1   verapamil (CALAN-SR) 180 MG CR tablet Take 1 tablet (180 mg total) by mouth every morning. 90 tablet 1   Current Facility-Administered Medications on File Prior to Visit  Medication Dose Route Frequency Provider Last Rate Last Admin   Romosozumab-aqqg (EVENITY) 105 MG/1. injection 210 mg   210 mg Subcutaneous Q30 days Blane Ohara, MD   210 mg at 08/17/22 1516   Past Medical History:  Diagnosis Date   Acquired hypothyroidism    Anemia    Aortic atherosclerosis (HCC)    Asthma    Dyslipidemia    Essential hypertension 07/05/2015   Hypothyroidism 06/25/2019   Mixed hyperlipidemia 06/25/2019   Osteoporosis    Stroke (cerebrum) (HCC)    Syncope    Vitamin B12 deficiency (non anemic) 06/17/2019   Past Surgical History:  Procedure Laterality Date   CARDIAC CATHETERIZATION     CHOLECYSTECTOMY      Family History  Problem Relation Age of Onset   Hypertension Mother    Hyperlipidemia Mother    Hypertension Father    Hyperlipidemia Father    Social History   Socioeconomic History   Marital status: Married    Spouse name: Not on file   Number of children: Not on file   Years of education: Not on file   Highest education level: Not on file  Occupational History   Not on file  Tobacco Use   Smoking status: Never   Smokeless tobacco: Never  Vaping Use   Vaping Use: Never used  Substance and Sexual Activity   Alcohol use: Never   Drug use: Never   Sexual activity: Not Currently  Other Topics Concern   Not on file  Social History Narrative   Not on file   Social Determinants of Health   Financial Resource Strain: Low Risk  (06/22/2022)   Overall Financial Resource Strain (CARDIA)    Difficulty of Paying Living Expenses: Not hard at all  Food Insecurity: No Food Insecurity (06/22/2022)   Hunger Vital Sign    Worried About Running Out of Food in the Last Year: Never true    Ran Out of Food in the Last Year: Never true  Transportation Needs: No Transportation Needs (06/22/2022)   PRAPARE - Administrator, Civil Service (Medical): No    Lack of Transportation (Non-Medical): No  Physical Activity: Inactive (06/22/2022)   Exercise Vital Sign    Days of Exercise per Week: 0 days    Minutes of Exercise per Session: 0 min  Stress: Stress Concern Present  (06/22/2022)   [employer] of Occupational Health - Occupational Stress Questionnaire    Feeling of Stress : Rather much  Social Connections: Socially Integrated (06/22/2022)   Social Connection and Isolation Panel [NHANES]    Frequency of Communication with Friends and Family: More than three times a  week    Frequency of Social Gatherings with Friends and Family: Twice a week    Attends Religious Services: More than 4 times per year    Active Member of [employer] or Organizations: Yes    Attends Engineer, structural: More than 4 times per year    Marital Status: Married    Objective:  BP 122/70   Pulse 76   Temp 98.4 F (36.9 C)   Resp 12   Ht 5' (1.524 m)   Wt 88 lb (39.9 kg)   SpO2 99%   BMI 17.19 kg/m      08/17/2022    1:35 PM 08/03/2022    9:35 AM 06/22/2022    9:19 AM  BP/Weight  Systolic BP 122 130 127  Diastolic BP 70 62 80  Wt. (Lbs) 88 90 94  BMI 17.19 kg/m2 17.58 kg/m2 18.36 kg/m2    Physical Exam Constitutional:      Appearance: Normal appearance.  Cardiovascular:     Rate and Rhythm: Normal rate and regular rhythm.     Heart sounds: Normal heart sounds.  Pulmonary:     Effort: Pulmonary effort is normal.     Breath sounds: Normal breath sounds.  Abdominal:     General: Bowel sounds are normal.     Palpations: Abdomen is soft.  Neurological:     Mental Status: She is alert and oriented to person, place, and time.  Psychiatric:        Behavior: Behavior normal.     Diabetic Foot Exam - Simple   No data filed      Lab Results  Component Value Date   WBC 6.4 05/15/2022   HGB 13.7 05/15/2022   HCT 40.7 05/15/2022   PLT 393 05/15/2022   GLUCOSE 89 05/15/2022   CHOL 153 05/15/2022   TRIG 88 05/15/2022   HDL 75 05/15/2022   LDLCALC 62 05/15/2022   ALT 17 05/15/2022   AST 25 05/15/2022   NA 139 05/15/2022   K 5.3 (H) 05/15/2022   CL 101 05/15/2022   CREATININE 1.04 (H) 05/15/2022   BUN 11 05/15/2022   CO2 22 05/15/2022   TSH  2.270 05/15/2022      Assessment & Plan:    Chronic idiopathic constipation Assessment & Plan: Uncontroled, but now improving since visit to the ED Continue to work on eating a healthy diet and exercise.    No major side effects reported, and no issues with compliance. The current medical regimen is effective;  continue present plan with Miralax 17g and Dulcolax 5mg     Abnormal urinalysis -     POCT URINALYSIS DIP (CLINITEK)     No orders of the defined types were placed in this encounter.   Orders Placed This Encounter  Procedures   POCT URINALYSIS DIP (CLINITEK)     Follow-up: Return if symptoms worsen or fail to improve.   I,Marla I Leal-Borjas,acting as a scribe for [employer], PA.,have documented all relevant documentation on the behalf of Langley Gauss, PA,as directed by  Langley Gauss, PA while in the presence of Langley Gauss, [place].   An After Visit Summary was printed and given to the patient.  Langley Gauss, [place] [facility] [phone]

## 2022-09-11 ENCOUNTER — Encounter: Payer: Self-pay | Admitting: Family Medicine

## 2022-09-18 ENCOUNTER — Ambulatory Visit (INDEPENDENT_AMBULATORY_CARE_PROVIDER_SITE_OTHER): Payer: Medicare Other

## 2022-09-18 DIAGNOSIS — M818 Other osteoporosis without current pathological fracture: Secondary | ICD-10-CM

## 2022-09-18 MED ORDER — ROMOSOZUMAB-AQQG 105 MG/1.17ML ~~LOC~~ SOSY
210.0000 mg | PREFILLED_SYRINGE | Freq: Once | SUBCUTANEOUS | Status: DC
Start: 2022-09-18 — End: 2022-09-25

## 2022-09-18 NOTE — Progress Notes (Signed)
Subjective:  Patient ID: Barbara Duncan, female    DOB: 1950-07-25  Age: 72 y.o. MRN: [id number]  Chief Complaint  Patient presents with   Osteoporosis    HPI     Patient: Barbara Duncan  DOB: 1950/07/26  MRN: [id number]    Visit Date: 09/18/2022    Barbara Duncan presents today for her monthly Evenity injection.  2 x 105 mg/1.17 ml Single-Dose Prefilled Syringes were given SQ, one in each arm.  Patient tolerated the injection well and has no questions.  The next Evenity injections will be due in one month.      Barbara Ferdinand, RN       08/17/2022    1:44 PM 06/22/2022    9:36 AM 05/15/2022    8:57 AM 11/08/2021    9:03 AM 11/01/2020    9:53 AM  Depression screen PHQ 2/9  Decreased Interest 0 0 0 0 0  Down, Depressed, Hopeless 0 0 0 0 0  PHQ - 2 Score 0 0 0 0 0  Altered sleeping 0      Tired, decreased energy 0      Change in appetite 0      Feeling bad or failure about yourself  0      Trouble concentrating 0      Moving slowly or fidgety/restless 0      Suicidal thoughts 0      PHQ-9 Score 0      Difficult doing work/chores Not difficult at all            08/17/2022    1:43 PM  Fall Risk   Falls in the past year? 0  Number falls in past yr: 0  Injury with Fall? 0  Risk for fall due to : No Fall Risks  Follow up Falls prevention discussed    Patient Care Team: Blane Ohara, MD as PCP - General (Family Medicine) Illene Bolus as Referring Physician (Obstetrics and Gynecology)   Review of Systems  Current Outpatient Medications on File Prior to Visit  Medication Sig Dispense Refill   aspirin EC 81 MG tablet Take 1 tablet (81 mg total) by mouth daily. 90 tablet 3   bisacodyl (DULCOLAX) 5 MG EC tablet Take 5 mg by mouth daily as needed for moderate constipation.     Calcium Carb-Cholecalciferol (CALCIUM 600 + D PO) Take by mouth.     levothyroxine (SYNTHROID) 25 MCG tablet TAKE ONE TABLET BY MOUTH EVERY DAY 90 tablet 2   pantoprazole (PROTONIX) 40 MG tablet  TAKE ONE TABLET BY MOUTH ONCE DAILY 90 tablet 2   polyethylene glycol (MIRALAX / GLYCOLAX) 17 g packet Take 17 g by mouth daily.     pravastatin (PRAVACHOL) 20 MG tablet Take 1 tablet (20 mg total) by mouth daily. 90 tablet 1   verapamil (CALAN-SR) 180 MG CR tablet Take 1 tablet (180 mg total) by mouth every morning. 90 tablet 1   Current Facility-Administered Medications on File Prior to Visit  Medication Dose Route Frequency Provider Last Rate Last Admin   Romosozumab-aqqg (EVENITY) 105 MG/1. injection 210 mg  210 mg Subcutaneous Q30 days Blane Ohara, MD   210 mg at 08/17/22 1516   Past Medical History:  Diagnosis Date   Acquired hypothyroidism    Anemia    Aortic atherosclerosis (HCC)    Asthma    Dyslipidemia    Essential hypertension 07/05/2015   Hypothyroidism 06/25/2019   Mixed hyperlipidemia 06/25/2019   Osteoporosis  Stroke (cerebrum) (HCC)    Syncope    Vitamin B12 deficiency (non anemic) 06/17/2019   Past Surgical History:  Procedure Laterality Date   CARDIAC CATHETERIZATION     CHOLECYSTECTOMY      Family History  Problem Relation Age of Onset   Hypertension Mother    Hyperlipidemia Mother    Hypertension Father    Hyperlipidemia Father    Social History   Socioeconomic History   Marital status: Married    Spouse name: Not on file   Number of children: Not on file   Years of education: Not on file   Highest education level: Not on file  Occupational History   Not on file  Tobacco Use   Smoking status: Never   Smokeless tobacco: Never  Vaping Use   Vaping Use: Never used  Substance and Sexual Activity   Alcohol use: Never   Drug use: Never   Sexual activity: Not Currently  Other Topics Concern   Not on file  Social History Narrative   Not on file   Social Determinants of Health   Financial Resource Strain: Low Risk  (06/22/2022)   Overall Financial Resource Strain (CARDIA)    Difficulty of Paying Living Expenses: Not hard at all  Food  Insecurity: No Food Insecurity (06/22/2022)   Hunger Vital Sign    Worried About Running Out of Food in the Last Year: Never true    Ran Out of Food in the Last Year: Never true  Transportation Needs: No Transportation Needs (06/22/2022)   PRAPARE - Administrator, Civil Service (Medical): No    Lack of Transportation (Non-Medical): No  Physical Activity: Inactive (06/22/2022)   Exercise Vital Sign    Days of Exercise per Week: 0 days    Minutes of Exercise per Session: 0 min  Stress: Stress Concern Present (06/22/2022)   Barbara Duncan    Feeling of Stress : Rather much  Social Connections: Socially Integrated (06/22/2022)   Social Connection and Isolation Panel [NHANES]    Frequency of Communication with Friends and Family: More than three times a week    Frequency of Social Gatherings with Friends and Family: Twice a week    Attends Religious Services: More than 4 times per year    Active Member of [employer] or Organizations: Yes    Attends Engineer, structural: More than 4 times per year    Marital Status: Married    Objective:  There were no vitals taken for this visit.     08/17/2022    1:35 PM 08/03/2022    9:35 AM 06/22/2022    9:19 AM  BP/Weight  Systolic BP 122 130 127  Diastolic BP 70 62 80  Wt. (Lbs) 88 90 94  BMI 17.19 kg/m2 17.58 kg/m2 18.36 kg/m2    Physical Exam  Diabetic Foot Exam - Simple   No data filed      Lab Results  Component Value Date   WBC 6.4 05/15/2022   HGB 13.7 05/15/2022   HCT 40.7 05/15/2022   PLT 393 05/15/2022   GLUCOSE 89 05/15/2022   CHOL 153 05/15/2022   TRIG 88 05/15/2022   HDL 75 05/15/2022   LDLCALC 62 05/15/2022   ALT 17 05/15/2022   AST 25 05/15/2022   NA 139 05/15/2022   K 5.3 (H) 05/15/2022   CL 101 05/15/2022   CREATININE 1.04 (H) 05/15/2022   BUN 11 05/15/2022  CO2 22 05/15/2022   TSH 2.270 05/15/2022      Assessment & Plan:     Other osteoporosis without current pathological fracture     No orders of the defined types were placed in this encounter.   No orders of the defined types were placed in this encounter.    Follow-up: No follow-ups on file.   I,Tawnie Ehresman M Samina Weekes,acting as a scribe for [employer].,have documented all relevant documentation on the behalf of [employer],as directed by  [employer] while in the presence of [employer].   An After Visit Summary was printed and given to the patient.  [employer] [facility] [phone]

## 2022-09-25 ENCOUNTER — Encounter: Payer: Self-pay | Admitting: Family Medicine

## 2022-09-25 ENCOUNTER — Ambulatory Visit (INDEPENDENT_AMBULATORY_CARE_PROVIDER_SITE_OTHER): Payer: Medicare Other | Admitting: Family Medicine

## 2022-09-25 VITALS — BP 128/76 | HR 82 | Temp 97.1°F | Ht 60.0 in | Wt 80.0 lb

## 2022-09-25 DIAGNOSIS — R21 Rash and other nonspecific skin eruption: Secondary | ICD-10-CM | POA: Diagnosis not present

## 2022-09-25 HISTORY — DX: Rash and other nonspecific skin eruption: R21

## 2022-09-25 MED ORDER — PREDNISONE 10 MG PO TABS
ORAL_TABLET | ORAL | 0 refills | Status: AC
Start: 2022-09-25 — End: 2022-09-30

## 2022-09-25 MED ORDER — TRIAMCINOLONE ACETONIDE 40 MG/ML IJ SUSP
40.0000 mg | Freq: Once | INTRAMUSCULAR | Status: AC
Start: 2022-09-25 — End: 2022-09-25
  Administered 2022-09-25: 40 mg via INTRAMUSCULAR

## 2022-09-25 NOTE — Progress Notes (Signed)
Acute Office Visit  Subjective:    Patient ID: Barbara Duncan, female    DOB: Sep 03, 1950, 72 y.o.   MRN: [id number]  Chief Complaint  Patient presents with   Allergic Reaction    HPI: Patient is in today for allergic reaction to Evenity injection (this was her fourth injection). States she had injection last Monday, on Tuesday had abdominal itching. By Friday was had hives and whelps, started taking benadryl Sunday once every 4 hours daily which helped some. Last took benadryl at 9 this morning. Denies anything new in her diet, laundry detergent or environment to her knowledge.    Media Information   Document Information  Photos    09/25/2022 10:56  Attached To:  Office Visit on 09/25/22 with Blane Ohara, MD  Source Information  Adleigh Mcmasters, MD  [facility]    Past Medical History:  Diagnosis Date   Acquired hypothyroidism    Anemia    Aortic atherosclerosis (HCC)    Asthma    Dyslipidemia    Essential hypertension 07/05/2015   Hypothyroidism 06/25/2019   Mixed hyperlipidemia 06/25/2019   Osteoporosis    Stroke (cerebrum) (HCC)    Syncope    Vitamin B12 deficiency (non anemic) 06/17/2019    Past Surgical History:  Procedure Laterality Date   CARDIAC CATHETERIZATION     CHOLECYSTECTOMY      Family History  Problem Relation Age of Onset   Hypertension Mother    Hyperlipidemia Mother    Hypertension Father    Hyperlipidemia Father     Social History   Socioeconomic History   Marital status: Married    Spouse name: Not on file   Number of children: Not on file   Years of education: Not on file   Highest education level: Not on file  Occupational History   Not on file  Tobacco Use   Smoking status: Never   Smokeless tobacco: Never  Vaping Use   Vaping Use: Never used  Substance and Sexual Activity   Alcohol use: Never   Drug use: Never   Sexual activity: Not Currently  Other Topics Concern   Not on file  Social History Narrative   Not  on file   Social Determinants of Health   Financial Resource Strain: Low Risk  (06/22/2022)   Overall Financial Resource Strain (CARDIA)    Difficulty of Paying Living Expenses: Not hard at all  Food Insecurity: No Food Insecurity (06/22/2022)   Hunger Vital Sign    Worried About Running Out of Food in the Last Year: Never true    Ran Out of Food in the Last Year: Never true  Transportation Needs: No Transportation Needs (06/22/2022)   PRAPARE - Administrator, Civil Service (Medical): No    Lack of Transportation (Non-Medical): No  Physical Activity: Inactive (09/25/2022)   Exercise Vital Sign    Days of Exercise per Week: 0 days    Minutes of Exercise per Session: 0 min  Stress: No Stress Concern Present (09/25/2022)   [employer] of Occupational Health - Occupational Stress Questionnaire    Feeling of Stress : Not at all  Social Connections: Socially Integrated (06/22/2022)   Social Connection and Isolation Panel [NHANES]    Frequency of Communication with Friends and Family: More than three times a week    Frequency of Social Gatherings with Friends and Family: Twice a week    Attends Religious Services: More than 4 times per year    Active  Member of Clubs or Organizations: Yes    Attends Engineer, structural: More than 4 times per year    Marital Status: Married  Catering manager Violence: Not At Risk (06/22/2022)   Humiliation, Afraid, Rape, and Kick questionnaire    Fear of Current or Ex-Partner: No    Emotionally Abused: No    Physically Abused: No    Sexually Abused: No    Outpatient Medications Prior to Visit  Medication Sig Dispense Refill   aspirin EC 81 MG tablet Take 1 tablet (81 mg total) by mouth daily. 90 tablet 3   bisacodyl (DULCOLAX) 5 MG EC tablet Take 5 mg by mouth daily as needed for moderate constipation.     Calcium Carb-Cholecalciferol (CALCIUM 600 + D PO) Take by mouth.     levothyroxine (SYNTHROID) 25 MCG tablet TAKE ONE TABLET  BY MOUTH EVERY DAY 90 tablet 2   pantoprazole (PROTONIX) 40 MG tablet TAKE ONE TABLET BY MOUTH ONCE DAILY 90 tablet 2   polyethylene glycol (MIRALAX / GLYCOLAX) 17 g packet Take 17 g by mouth daily.     pravastatin (PRAVACHOL) 20 MG tablet Take 1 tablet (20 mg total) by mouth daily. 90 tablet 1   verapamil (CALAN-SR) 180 MG CR tablet Take 1 tablet (180 mg total) by mouth every morning. 90 tablet 1   Romosozumab-aqqg (EVENITY) 105 MG/1. injection 210 mg      Romosozumab-aqqg (EVENITY) 105 MG/1. injection 210 mg      No facility-administered medications prior to visit.    No Active Allergies   Review of Systems  Constitutional:  Negative for chills, fatigue and fever.  HENT:  Negative for rhinorrhea and sore throat.   Respiratory:  Negative for cough and shortness of breath.   Cardiovascular:  Negative for chest pain.  Gastrointestinal:  Negative for nausea and vomiting.  Musculoskeletal:  Negative for back pain.  Skin:  Positive for rash.       Objective:        09/25/2022   10:34 AM 08/17/2022    1:35 PM 08/03/2022    9:35 AM  Vitals with BMI  Height 5\' 0"  5\' 0"  5\' 0"   Weight 80 lbs 88 lbs 90 lbs  BMI 15.62 17.19 17.58  Systolic 128 122 161  Diastolic 76 70 62  Pulse 82 76 79    No data found.   Physical Exam Skin:    Findings: Erythema and rash present. Rash is vesicular.     Comments: See pictures for reference under media.   Rash is on Both extremities, anterior and posterior torso, neck. Spares hands, feet, and face.   Health Maintenance Due  Topic Date Due   COVID-19 Vaccine (6 - 2023-24 season) 12/09/2021   Colonoscopy  12/26/2022    There are no preventive care reminders to display for this patient.   Lab Results  Component Value Date   TSH 2.270 05/15/2022   Lab Results  Component Value Date   WBC 6.4 05/15/2022   HGB 13.7 05/15/2022   HCT 40.7 05/15/2022   MCV 89 05/15/2022   PLT 393 05/15/2022   Lab Results  Component Value Date    NA 139 05/15/2022   K 5.3 (H) 05/15/2022   CO2 22 05/15/2022   GLUCOSE 89 05/15/2022   BUN 11 05/15/2022   CREATININE 1.04 (H) 05/15/2022   BILITOT 0.5 05/15/2022   ALKPHOS 69 05/15/2022   AST 25 05/15/2022   ALT 17 05/15/2022   PROT 7.2 05/15/2022  ALBUMIN 4.9 05/15/2022   CALCIUM 10.1 05/15/2022   EGFR 60.0 08/10/2022   Lab Results  Component Value Date   CHOL 153 05/15/2022   Lab Results  Component Value Date   HDL 75 05/15/2022   Lab Results  Component Value Date   LDLCALC 62 05/15/2022   Lab Results  Component Value Date   TRIG 88 05/15/2022   Lab Results  Component Value Date   CHOLHDL 2.0 05/15/2022   No results found for: "HGBA1C"     Assessment & Plan:  Rash -     Triamcinolone Acetonide  Other orders -     predniSONE; Take 6 tablets (60 mg total) by mouth daily with breakfast for 1 day, THEN 5 tablets (50 mg total) daily with breakfast for 1 day, THEN 4 tablets (40 mg total) daily with breakfast for 1 day, THEN 3 tablets (30 mg total) daily with breakfast for 1 day, THEN 2 tablets (20 mg total) daily with breakfast for 1 day, THEN 1 tablet (10 mg total) daily with breakfast for 1 day.  Dispense: 21 tablet; Refill: 0     Meds ordered this encounter  Medications   triamcinolone acetonide (KENALOG-40) injection 40 mg   predniSONE (DELTASONE) 10 MG tablet    Sig: Take 6 tablets (60 mg total) by mouth daily with breakfast for 1 day, THEN 5 tablets (50 mg total) daily with breakfast for 1 day, THEN 4 tablets (40 mg total) daily with breakfast for 1 day, THEN 3 tablets (30 mg total) daily with breakfast for 1 day, THEN 2 tablets (20 mg total) daily with breakfast for 1 day, THEN 1 tablet (10 mg total) daily with breakfast for 1 day.    Dispense:  21 tablet    Refill:  0    No orders of the defined types were placed in this encounter.    Follow-up: No follow-ups on file.  An After Visit Summary was printed and given to the patient.  Blane Ohara,  MD [facility] [phone]

## 2022-09-25 NOTE — Assessment & Plan Note (Signed)
Vesicular rash. Inconsistent with hives. Patient is okay to take evenity next month.  Prednisone prescription sent.  Take with food.  If rash recurs following completion, call our office and we will refer to dermatology.

## 2022-09-25 NOTE — Patient Instructions (Signed)
Prednisone prescription sent.  Take with food.  If rash recurs following completion, call our office and we will refer to dermatology.

## 2022-10-11 NOTE — Progress Notes (Signed)
Wrong office

## 2022-10-18 ENCOUNTER — Ambulatory Visit: Payer: Medicare Other

## 2022-10-25 ENCOUNTER — Telehealth: Payer: Self-pay

## 2022-10-25 NOTE — Telephone Encounter (Signed)
LM for patient to confirm if she wants to go forward with Evenity.  Per message, she had an allergic reaction to it last month

## 2022-10-27 ENCOUNTER — Telehealth: Payer: Self-pay

## 2022-10-27 NOTE — Telephone Encounter (Signed)
Called patient to schedule Evenity Injection, left message for patient to call office back.

## 2022-11-10 DIAGNOSIS — Z1231 Encounter for screening mammogram for malignant neoplasm of breast: Secondary | ICD-10-CM | POA: Diagnosis not present

## 2022-11-14 ENCOUNTER — Encounter: Payer: Self-pay | Admitting: Family Medicine

## 2022-11-14 ENCOUNTER — Ambulatory Visit (INDEPENDENT_AMBULATORY_CARE_PROVIDER_SITE_OTHER): Payer: Medicare Other | Admitting: Family Medicine

## 2022-11-14 VITALS — BP 106/72 | HR 68 | Temp 96.1°F | Resp 16 | Ht 60.0 in | Wt 88.2 lb

## 2022-11-14 DIAGNOSIS — E038 Other specified hypothyroidism: Secondary | ICD-10-CM

## 2022-11-14 DIAGNOSIS — E782 Mixed hyperlipidemia: Secondary | ICD-10-CM

## 2022-11-14 DIAGNOSIS — I10 Essential (primary) hypertension: Secondary | ICD-10-CM

## 2022-11-14 DIAGNOSIS — M818 Other osteoporosis without current pathological fracture: Secondary | ICD-10-CM | POA: Diagnosis not present

## 2022-11-14 DIAGNOSIS — Z1211 Encounter for screening for malignant neoplasm of colon: Secondary | ICD-10-CM

## 2022-11-14 MED ORDER — ROMOSOZUMAB-AQQG 105 MG/1.17ML ~~LOC~~ SOSY
210.0000 mg | PREFILLED_SYRINGE | Freq: Once | SUBCUTANEOUS | Status: AC
Start: 2022-11-14 — End: 2022-11-14
  Administered 2022-11-14: 210 mg via SUBCUTANEOUS

## 2022-11-14 NOTE — Progress Notes (Unsigned)
Subjective:  Patient ID: Barbara Duncan, female    DOB: 1950/10/05  Age: 72 y.o. MRN: [id number]  HPI Hyperlipidemia. Patient is using Pravastatin 20 mg daily without problems.     Osteoporosis: She is taking evenity monthly.     Hypertension:   Patient tolerating Verapamil 180 mg every morning, Aspirin 81 mg daily well without side effects.    Hypothyroidism: She takes Levothyroxine 25 mcg daily.   GERD: on pantoprazole 40 mg daily as needed.      11/14/2022    8:21 AM 08/17/2022    1:44 PM 06/22/2022    9:36 AM 05/15/2022    8:57 AM 11/08/2021    9:03 AM  Depression screen PHQ 2/9  Decreased Interest 0 0 0 0 0  Down, Depressed, Hopeless 0 0 0 0 0  PHQ - 2 Score 0 0 0 0 0  Altered sleeping 0 0     Tired, decreased energy 0 0     Change in appetite 0 0     Feeling bad or failure about yourself  0 0     Trouble concentrating 0 0     Moving slowly or fidgety/restless 0 0     Suicidal thoughts 0 0     PHQ-9 Score 0 0     Difficult doing work/chores Not difficult at all Not difficult at all           11/14/2022    8:21 AM  Fall Risk   Falls in the past year? 0  Number falls in past yr: 0  Injury with Fall? 0  Risk for fall due to : No Fall Risks  Follow up Falls evaluation completed;Follow up appointment    Patient Care Team: Blane Ohara, MD as PCP - General (Family Medicine) Illene Bolus as Referring Physician (Obstetrics and Gynecology)   Review of Systems  Constitutional:  Negative for chills, fatigue and fever.  HENT:  Negative for congestion, ear pain, rhinorrhea and sore throat.   Respiratory:  Negative for cough and shortness of breath.   Cardiovascular:  Negative for chest pain.  Gastrointestinal:  Negative for abdominal pain, constipation, diarrhea, nausea and vomiting.  Genitourinary:  Negative for dysuria and urgency.  Musculoskeletal:  Negative for back pain and myalgias.  Neurological:  Negative for dizziness, weakness, light-headedness and headaches.   Psychiatric/Behavioral:  Negative for dysphoric mood. The patient is not nervous/anxious.     Current Outpatient Medications on File Prior to Visit  Medication Sig Dispense Refill   Romosozumab-aqqg (EVENITY) 105 MG/1. SOSY injection Inject 210 mg into the skin once.     aspirin EC 81 MG tablet Take 1 tablet (81 mg total) by mouth daily. 90 tablet 3   bisacodyl (DULCOLAX) 5 MG EC tablet Take 5 mg by mouth daily as needed for moderate constipation.     levothyroxine (SYNTHROID) 25 MCG tablet TAKE ONE TABLET BY MOUTH EVERY DAY 90 tablet 2   pantoprazole (PROTONIX) 40 MG tablet TAKE ONE TABLET BY MOUTH ONCE DAILY 90 tablet 2   pravastatin (PRAVACHOL) 20 MG tablet Take 1 tablet (20 mg total) by mouth daily. 90 tablet 1   verapamil (CALAN-SR) 180 MG CR tablet Take 1 tablet (180 mg total) by mouth every morning. 90 tablet 1   No current facility-administered medications on file prior to visit.   Past Medical History:  Diagnosis Date   Acquired hypothyroidism    Anemia    Aortic atherosclerosis (HCC)    Asthma  Dyslipidemia    Essential hypertension 07/05/2015   Hypothyroidism 06/25/2019   Mixed hyperlipidemia 06/25/2019   Osteoporosis    Stroke (cerebrum) (HCC)    Syncope    Vitamin B12 deficiency (non anemic) 06/17/2019   Past Surgical History:  Procedure Laterality Date   CARDIAC CATHETERIZATION     CHOLECYSTECTOMY      Family History  Problem Relation Age of Onset   Hypertension Mother    Hyperlipidemia Mother    Hypertension Father    Hyperlipidemia Father    Social History   Socioeconomic History   Marital status: Married    Spouse name: Not on file   Number of children: Not on file   Years of education: Not on file   Highest education level: Not on file  Occupational History   Not on file  Tobacco Use   Smoking status: Never   Smokeless tobacco: Never  Vaping Use   Vaping status: Never Used  Substance and Sexual Activity   Alcohol use: Never   Drug use:  Never   Sexual activity: Not Currently  Other Topics Concern   Not on file  Social History Narrative   Not on file   Social Determinants of Health   Financial Resource Strain: Low Risk  (06/22/2022)   Overall Financial Resource Strain (CARDIA)    Difficulty of Paying Living Expenses: Not hard at all  Food Insecurity: No Food Insecurity (06/22/2022)   Hunger Vital Sign    Worried About Running Out of Food in the Last Year: Never true    Ran Out of Food in the Last Year: Never true  Transportation Needs: No Transportation Needs (06/22/2022)   PRAPARE - Administrator, Civil Service (Medical): No    Lack of Transportation (Non-Medical): No  Physical Activity: Inactive (09/25/2022)   Exercise Vital Sign    Days of Exercise per Week: 0 days    Minutes of Exercise per Session: 0 min  Stress: No Stress Concern Present (09/25/2022)   [employer] of Occupational Health - Occupational Stress Questionnaire    Feeling of Stress : Not at all  Social Connections: Socially Integrated (06/22/2022)   Social Connection and Isolation Panel [NHANES]    Frequency of Communication with Friends and Family: More than three times a week    Frequency of Social Gatherings with Friends and Family: Twice a week    Attends Religious Services: More than 4 times per year    Active Member of [employer] or Organizations: Yes    Attends Engineer, structural: More than 4 times per year    Marital Status: Married    Objective:  BP 106/72   Pulse 68   Temp (!) 96.1 F (35.6 C)   Resp 16   Ht 5' (1.524 m)   Wt 88 lb 3.2 oz (40 kg)   BMI 17.23 kg/m      11/14/2022    8:15 AM 09/25/2022   10:34 AM 08/17/2022    1:35 PM  BP/Weight  Systolic BP 106 128 122  Diastolic BP 72 76 70  Wt. (Lbs) 88.2 80 88  BMI 17.23 kg/m2 15.62 kg/m2 17.19 kg/m2    Physical Exam Vitals reviewed.  Constitutional:      Appearance: Normal appearance. She is normal weight.  Neck:     Vascular: No carotid  bruit.  Cardiovascular:     Rate and Rhythm: Normal rate and regular rhythm.     Heart sounds: Normal heart sounds.  Pulmonary:  Effort: Pulmonary effort is normal. No respiratory distress.     Breath sounds: Normal breath sounds.  Abdominal:     General: Abdomen is flat. Bowel sounds are normal.     Palpations: Abdomen is soft.     Tenderness: There is no abdominal tenderness.  Neurological:     Mental Status: She is alert and oriented to person, place, and time.  Psychiatric:        Mood and Affect: Mood normal.        Behavior: Behavior normal.     Diabetic Foot Exam - Simple   No data filed      Lab Results  Component Value Date   WBC 8.3 11/14/2022   HGB 13.2 11/14/2022   HCT 39.1 11/14/2022   PLT 302 11/14/2022   GLUCOSE 79 11/14/2022   CHOL 176 11/14/2022   TRIG 53 11/14/2022   HDL 83 11/14/2022   LDLCALC 82 11/14/2022   ALT 18 11/14/2022   AST 20 11/14/2022   NA 139 11/14/2022   K 5.3 (H) 11/14/2022   CL 102 11/14/2022   CREATININE 0.96 11/14/2022   BUN 13 11/14/2022   CO2 23 11/14/2022   TSH 2.270 05/15/2022      Assessment & Plan:    Essential hypertension Assessment & Plan: Well controlled.  No changes to medicines. Verapamil 180 mg every morning, aspirin 81 mg daily. Continue to work on eating a healthy diet and exercise.  Labs drawn today.    Orders: -     CBC with Differential/Platelet -     Comprehensive metabolic panel  Other specified hypothyroidism Assessment & Plan: Previously well controlled Continue Synthroid at current dose     Mixed hyperlipidemia Assessment & Plan: Well controlled.  No changes to medicines. Pravastatin 20 mg daily. Continue to work on eating a healthy diet and exercise.  Labs drawn today.    Orders: -     Lipid panel  Screening for colon cancer -     Ambulatory referral to Gastroenterology  Other osteoporosis without current pathological fracture -     Romosozumab-aqqg     Meds ordered  this encounter  Medications   Romosozumab-aqqg (EVENITY) 105 MG/1. injection 210 mg    Restricted to Outpatient Use.  Do Not Tube.    Orders Placed This Encounter  Procedures   CBC with Differential/Platelet   Comprehensive metabolic panel   Lipid panel   Ambulatory referral to Gastroenterology     Follow-up: Return in about 3 months (around 02/14/2023) for chronic.   I,Katherina A Bramblett,acting as a scribe for Blane Ohara, MD.,have documented all relevant documentation on the behalf of Blane Ohara, MD,as directed by  Blane Ohara, MD while in the presence of Blane Ohara, MD.   An After Visit Summary was printed and given to the patient.  Blane Ohara, MD [facility] [phone]

## 2022-11-14 NOTE — Assessment & Plan Note (Signed)
Well controlled.  No changes to medicines. Pravastatin 20 mg daily. Continue to work on eating a healthy diet and exercise.  Labs drawn today.  

## 2022-11-14 NOTE — Assessment & Plan Note (Addendum)
Previously well controlled Continue Synthroid at current dose  

## 2022-11-14 NOTE — Assessment & Plan Note (Signed)
Well controlled.  No changes to medicines. Verapamil 180 mg every morning, aspirin 81 mg daily. Continue to work on eating a healthy diet and exercise.  Labs drawn today.

## 2022-11-15 ENCOUNTER — Telehealth: Payer: Self-pay | Admitting: Family Medicine

## 2022-11-15 NOTE — Telephone Encounter (Signed)
Pt called to let the doctor and nurses know she took her bone shot and she is okay. She doesn't have no rash or any other sxs.

## 2022-11-15 NOTE — Telephone Encounter (Signed)
Good. Dr. Sedalia Muta

## 2022-11-16 ENCOUNTER — Encounter: Payer: Self-pay | Admitting: Family Medicine

## 2022-11-28 ENCOUNTER — Other Ambulatory Visit: Payer: Self-pay

## 2022-11-28 DIAGNOSIS — E039 Hypothyroidism, unspecified: Secondary | ICD-10-CM

## 2022-11-28 MED ORDER — LEVOTHYROXINE SODIUM 25 MCG PO TABS
25.0000 ug | ORAL_TABLET | Freq: Every day | ORAL | 2 refills | Status: DC
Start: 2022-11-28 — End: 2023-08-30

## 2022-12-18 ENCOUNTER — Ambulatory Visit (INDEPENDENT_AMBULATORY_CARE_PROVIDER_SITE_OTHER): Payer: Medicare Other

## 2022-12-18 DIAGNOSIS — M818 Other osteoporosis without current pathological fracture: Secondary | ICD-10-CM | POA: Diagnosis not present

## 2022-12-18 MED ORDER — ROMOSOZUMAB-AQQG 105 MG/1.17ML ~~LOC~~ SOSY
210.0000 mg | PREFILLED_SYRINGE | Freq: Once | SUBCUTANEOUS | Status: AC
Start: 2022-12-18 — End: 2022-12-18
  Administered 2022-12-18: 210 mg via SUBCUTANEOUS

## 2022-12-18 NOTE — Progress Notes (Signed)
Patient presents today for evenity injx. Injx given BL arm. SQ. Patient to follow up in 1 month as a nurse visit.

## 2022-12-21 ENCOUNTER — Other Ambulatory Visit: Payer: Self-pay | Admitting: Family Medicine

## 2022-12-30 ENCOUNTER — Encounter (HOSPITAL_COMMUNITY): Payer: Self-pay

## 2023-01-17 ENCOUNTER — Ambulatory Visit: Payer: Medicare Other

## 2023-01-17 DIAGNOSIS — M818 Other osteoporosis without current pathological fracture: Secondary | ICD-10-CM

## 2023-01-17 MED ORDER — ROMOSOZUMAB-AQQG 105 MG/1.17ML ~~LOC~~ SOSY
210.0000 mg | PREFILLED_SYRINGE | Freq: Once | SUBCUTANEOUS | Status: AC
Start: 2023-01-17 — End: 2023-01-17
  Administered 2023-01-17: 210 mg via SUBCUTANEOUS

## 2023-01-17 NOTE — Progress Notes (Signed)
   Patient: Barbara Duncan  DOB: [birth year]  MRN: [id number]    Visit Date: 01/17/2023    Karolyna Bianchini presents today for her monthly Evenity injection.  2 x 105 mg/1.17 ml Single-Dose Prefilled Syringes were given SQ, one in each arm.  Patient tolerated the injection well and has no questions.  The next Evenity injections will be due in one month.      Jomarie Longs, CMA

## 2023-02-19 ENCOUNTER — Ambulatory Visit (INDEPENDENT_AMBULATORY_CARE_PROVIDER_SITE_OTHER): Payer: Medicare Other

## 2023-02-19 DIAGNOSIS — M818 Other osteoporosis without current pathological fracture: Secondary | ICD-10-CM | POA: Diagnosis not present

## 2023-02-19 MED ORDER — ROMOSOZUMAB-AQQG 105 MG/1.17ML ~~LOC~~ SOSY
210.0000 mg | PREFILLED_SYRINGE | Freq: Once | SUBCUTANEOUS | Status: AC
Start: 2023-02-19 — End: 2023-02-19
  Administered 2023-02-19: 210 mg via SUBCUTANEOUS

## 2023-02-19 NOTE — Progress Notes (Signed)
   Patient: Barbara Duncan  DOB: 1950/06/23  MRN: [id number]    Visit Date: 02/19/2023    Barbara Duncan presents today for her monthly Evenity injection.  2 x 105 mg/1.17 ml Single-Dose Prefilled Syringes were given SQ, one in each arm.  Patient tolerated the injection well and has no questions.  The next Evenity injections will be due in one month.      Precious Reel, CMA

## 2023-03-05 NOTE — Progress Notes (Signed)
Subjective:  Patient ID: Barbara Duncan, female    DOB: 07-27-1950  Age: 72 y.o. MRN: [id number]  Chief Complaint  Patient presents with   Medical Management of Chronic Issues    HPI The patient, a 72 year old female, presents with frequent urination, which she reports as occurring every 15 minutes. This is particularly bothersome at work. She is currently on Lasix 40 mg, which she has been taking once a day instead of the prescribed twice a day. She reports no swelling in her legs.  The patient is also on pravastatin 20 mg daily for cholesterol management, but admits to not eating healthily despite regular walking. She is on metoprolol and citalopram, the latter of which was prescribed for previous panic attacks, which she no longer experiences. She also takes meloxicam for joint pain, which she reports as being manageable, although she has limited mobility in one shoulder.  The patient has had her flu and pneumonia shots, but is unsure about her last tetanus vaccine. She has not yet had a COVID-19 vaccine or a shingles vaccine. She has never had a colonoscopy and has not yet used a Cologuard test that was previously provided.  Hyperlipidemia. Patient is using Pravastatin 20 mg daily without problems.   Osteoporosis: She is taking evenity monthly.   Hypertension:  Patient tolerating Verapamil 180 mg every morning, Aspirin 81 mg daily well without side effects.  Hypothyroidism: She takes Levothyroxine 25 mcg daily.  GERD: on pantoprazole 40 mg daily.      11/14/2022    8:21 AM 08/17/2022    1:44 PM 06/22/2022    9:36 AM 05/15/2022    8:57 AM 11/08/2021    9:03 AM  Depression screen PHQ 2/9  Decreased Interest 0 0 0 0 0  Down, Depressed, Hopeless 0 0 0 0 0  PHQ - 2 Score 0 0 0 0 0  Altered sleeping 0 0     Tired, decreased energy 0 0     Change in appetite 0 0     Feeling bad or failure about yourself  0 0     Trouble concentrating 0 0     Moving slowly or fidgety/restless 0 0      Suicidal thoughts 0 0     PHQ-9 Score 0 0     Difficult doing work/chores Not difficult at all Not difficult at all           03/06/2023   10:05 AM 11/14/2022    8:22 AM 08/17/2022    1:44 PM  GAD 7 : Generalized Anxiety Score  Nervous, Anxious, on Edge 3 0 0  Control/stop worrying 0 0 0  Worry too much - different things 3 1 0  Trouble relaxing 0 0 0  Restless 0 0 0  Easily annoyed or irritable 0 0 0  Afraid - awful might happen 0 0 0  Total GAD 7 Score 6 1 0  Anxiety Difficulty  Not difficult at all Not difficult at all         11/14/2022    8:21 AM  Fall Risk   Falls in the past year? 0  Number falls in past yr: 0  Injury with Fall? 0  Risk for fall due to : No Fall Risks  Follow up Falls evaluation completed;Follow up appointment    Patient Care Team: Blane Ohara, MD as PCP - General (Family Medicine) Illene Bolus as Referring Physician (Obstetrics and Gynecology)   Review of Systems  Constitutional:  Negative  for appetite change, fatigue and fever.  HENT:  Negative for congestion, ear pain, sinus pressure and sore throat.   Respiratory:  Negative for cough, chest tightness, shortness of breath and wheezing.   Cardiovascular:  Negative for chest pain and palpitations.  Gastrointestinal:  Negative for abdominal pain, constipation, diarrhea, nausea and vomiting.  Genitourinary:  Negative for dysuria and hematuria.  Musculoskeletal:  Negative for arthralgias, back pain, joint swelling and myalgias.  Skin:  Negative for rash.  Neurological:  Negative for dizziness, weakness and headaches.  Psychiatric/Behavioral:  Negative for dysphoric mood. The patient is not nervous/anxious.     Current Outpatient Medications on File Prior to Visit  Medication Sig Dispense Refill   aspirin EC 81 MG tablet Take 1 tablet (81 mg total) by mouth daily. 90 tablet 3   bisacodyl (DULCOLAX) 5 MG EC tablet Take 5 mg by mouth daily as needed for moderate constipation.     levothyroxine  (SYNTHROID) 25 MCG tablet Take 1 tablet (25 mcg total) by mouth daily. 90 tablet 2   pantoprazole (PROTONIX) 40 MG tablet TAKE ONE TABLET BY MOUTH ONCE DAILY 90 tablet 2   pravastatin (PRAVACHOL) 20 MG tablet TAKE ONE TABLET BY MOUTH EVERY DAY 90 tablet 1   Romosozumab-aqqg (EVENITY) 105 MG/1. SOSY injection Inject 210 mg into the skin once.     verapamil (CALAN-SR) 180 MG CR tablet TAKE ONE TABLET BY MOUTH EVERY MORNING 90 tablet 1   No current facility-administered medications on file prior to visit.   Past Medical History:  Diagnosis Date   Acquired hypothyroidism    Anemia    Aortic atherosclerosis (HCC)    Asthma    Dyslipidemia    Essential hypertension 07/05/2015   Hypothyroidism 06/25/2019   Mixed hyperlipidemia 06/25/2019   Osteoporosis    Stroke (cerebrum) (HCC)    Syncope    Vitamin B12 deficiency (non anemic) 06/17/2019   Past Surgical History:  Procedure Laterality Date   CARDIAC CATHETERIZATION     CHOLECYSTECTOMY      Family History  Problem Relation Age of Onset   Hypertension Mother    Hyperlipidemia Mother    Hypertension Father    Hyperlipidemia Father    Social History   Socioeconomic History   Marital status: Married    Spouse name: Not on file   Number of children: Not on file   Years of education: Not on file   Highest education level: Not on file  Occupational History   Not on file  Tobacco Use   Smoking status: Never   Smokeless tobacco: Never  Vaping Use   Vaping status: Never Used  Substance and Sexual Activity   Alcohol use: Never   Drug use: Never   Sexual activity: Not Currently  Other Topics Concern   Not on file  Social History Narrative   Not on file   Social Determinants of Health   Financial Resource Strain: Low Risk  (06/22/2022)   Overall Financial Resource Strain (CARDIA)    Difficulty of Paying Living Expenses: Not hard at all  Food Insecurity: No Food Insecurity (06/22/2022)   Hunger Vital Sign    Worried About  Running Out of Food in the Last Year: Never true    Ran Out of Food in the Last Year: Never true  Transportation Needs: No Transportation Needs (06/22/2022)   PRAPARE - Administrator, Civil Service (Medical): No    Lack of Transportation (Non-Medical): No  Physical Activity: Inactive (09/25/2022)  Exercise Vital Sign    Days of Exercise per Week: 0 days    Minutes of Exercise per Session: 0 min  Stress: No Stress Concern Present (09/25/2022)   [employer] of Occupational Health - Occupational Stress Questionnaire    Feeling of Stress : Not at all  Social Connections: Socially Integrated (06/22/2022)   Social Connection and Isolation Panel [NHANES]    Frequency of Communication with Friends and Family: More than three times a week    Frequency of Social Gatherings with Friends and Family: Twice a week    Attends Religious Services: More than 4 times per year    Active Member of [employer] or Organizations: Yes    Attends Engineer, structural: More than 4 times per year    Marital Status: Married    Objective:  BP (!) 150/72   Pulse 64   Temp (!) 97.5 F (36.4 C) (Temporal)   Ht 5' (1.524 m)   Wt 89 lb 9.6 oz (40.6 kg)   SpO2 98%   BMI 17.50 kg/m      03/06/2023    9:46 AM 03/06/2023    9:34 AM 11/14/2022    8:15 AM  BP/Weight  Systolic BP 150 148 106  Diastolic BP 72 78 72  Wt. (Lbs)  89.6 88.2  BMI  17.5 kg/m2 17.23 kg/m2    Physical Exam Vitals reviewed.  Constitutional:      Appearance: Normal appearance. She is normal weight.  Neck:     Vascular: No carotid bruit.  Cardiovascular:     Rate and Rhythm: Normal rate and regular rhythm.     Heart sounds: Normal heart sounds.  Pulmonary:     Effort: Pulmonary effort is normal. No respiratory distress.     Breath sounds: Normal breath sounds.  Abdominal:     General: Abdomen is flat. Bowel sounds are normal.     Palpations: Abdomen is soft.     Tenderness: There is no abdominal tenderness.   Neurological:     Mental Status: She is alert and oriented to person, place, and time.  Psychiatric:        Mood and Affect: Mood normal.        Behavior: Behavior normal.     Diabetic Foot Exam - Simple   No data filed      Lab Results  Component Value Date   WBC 8.3 11/14/2022   HGB 13.2 11/14/2022   HCT 39.1 11/14/2022   PLT 302 11/14/2022   GLUCOSE 79 11/14/2022   CHOL 176 11/14/2022   TRIG 53 11/14/2022   HDL 83 11/14/2022   LDLCALC 82 11/14/2022   ALT 18 11/14/2022   AST 20 11/14/2022   NA 139 11/14/2022   K 5.3 (H) 11/14/2022   CL 102 11/14/2022   CREATININE 0.96 11/14/2022   BUN 13 11/14/2022   CO2 23 11/14/2022   TSH 2.270 05/15/2022      Assessment & Plan:    Essential hypertension Assessment & Plan: Elevated blood pressure in the office today, possibly related to recent stressors including the death of the patient's sister.  Home blood pressure readings have been within normal limits. -Continue current antihypertensive medication (Verapamil 180mg  daily).   Other specified hypothyroidism Assessment & Plan: Stable on Levothyroxine daily. Last thyroid function test in February was within normal limits. -Continue Levothyroxine daily.    Mixed hyperlipidemia Assessment & Plan: Well controlled on Pravastatin. Last cholesterol check in August showed good control. -Continue  Pravastatin. No need for repeat cholesterol check at this time. Recommend continue to work on eating healthy diet and exercise.    Encounter for immunization -     Flu Vaccine Trivalent High Dose (Fluad)  Aortic atherosclerosis (HCC) Assessment & Plan: Continue pravastatin and aspirin.   Other osteoporosis without current pathological fracture Assessment & Plan: On Evenity. No recent fractures despite physical trauma. Previous adverse reaction (hives) has resolved. -Continue Evenity.   GAD (generalized anxiety disorder) Assessment & Plan: Recent  stressors contributing to possible anxiety. This may be contributing to elevated blood pressure readings. -Start Citalopram 10mg  daily. Monitor for improvement in anxiety symptoms and blood pressure control.  Orders: -     Citalopram Hydrobromide; Take 1 tablet (10 mg total) by mouth daily.  Dispense: 30 tablet; Refill: 2    General Health Maintenance -Continue monitoring weight. Patient has a low baseline weight but this appears to be her norm. -Plan for follow-up to monitor blood pressure and response to Citalopram.  Meds ordered this encounter  Medications   citalopram (CELEXA) 10 MG tablet    Sig: Take 1 tablet (10 mg total) by mouth daily.    Dispense:  30 tablet    Refill:  2    Orders Placed This Encounter  Procedures   Flu Vaccine Trivalent High Dose (Fluad)     Follow-up: Return in about 3 weeks (around 03/27/2023) for chronic follow up for anxiety and hypertension, Dina.   I,Marla I Leal-Borjas,acting as a scribe for Blane Ohara, MD.,have documented all relevant documentation on the behalf of Blane Ohara, MD,as directed by  Blane Ohara, MD while in the presence of Blane Ohara, MD.   An After Visit Summary was printed and given to the patient.  Blane Ohara, MD [facility] [phone]

## 2023-03-05 NOTE — Assessment & Plan Note (Addendum)
Stable on Levothyroxine daily. Last thyroid function test in February was within normal limits. -Continue Levothyroxine daily.

## 2023-03-05 NOTE — Assessment & Plan Note (Addendum)
Well controlled.  No changes to medicines. Pravastatin 20 mg daily. Continue to work on eating a healthy diet and exercise.

## 2023-03-05 NOTE — Assessment & Plan Note (Addendum)
Well controlled.  No changes to medicines. Verapamil 180 mg every morning, aspirin 81 mg daily. Continue to work on eating a healthy diet and exercise.

## 2023-03-06 ENCOUNTER — Ambulatory Visit: Payer: Medicare Other | Admitting: Family Medicine

## 2023-03-06 ENCOUNTER — Encounter: Payer: Self-pay | Admitting: Family Medicine

## 2023-03-06 VITALS — BP 150/72 | HR 64 | Temp 97.5°F | Ht 60.0 in | Wt 89.6 lb

## 2023-03-06 DIAGNOSIS — M818 Other osteoporosis without current pathological fracture: Secondary | ICD-10-CM | POA: Diagnosis not present

## 2023-03-06 DIAGNOSIS — Z23 Encounter for immunization: Secondary | ICD-10-CM | POA: Diagnosis not present

## 2023-03-06 DIAGNOSIS — I10 Essential (primary) hypertension: Secondary | ICD-10-CM

## 2023-03-06 DIAGNOSIS — E782 Mixed hyperlipidemia: Secondary | ICD-10-CM

## 2023-03-06 DIAGNOSIS — I700 Atherosclerosis of aorta: Secondary | ICD-10-CM | POA: Diagnosis not present

## 2023-03-06 DIAGNOSIS — E038 Other specified hypothyroidism: Secondary | ICD-10-CM

## 2023-03-06 DIAGNOSIS — F411 Generalized anxiety disorder: Secondary | ICD-10-CM | POA: Diagnosis not present

## 2023-03-06 MED ORDER — CITALOPRAM HYDROBROMIDE 10 MG PO TABS: 10.0000 mg | ORAL_TABLET | Freq: Every day | ORAL | 2 refills | Status: DC

## 2023-03-06 NOTE — Patient Instructions (Signed)
VISIT SUMMARY:  During today's visit, we discussed your high blood pressure, which may be related to recent stressors such as the death of your sister and the sale of your family home. We also reviewed your ongoing management of osteoporosis, thyroid issues, high cholesterol, and introduced a new treatment for anxiety.  YOUR PLAN:  -HYPERTENSION: Hypertension means high blood pressure. Your home readings are within normal limits, so continue taking Verapamil 180mg  daily as prescribed.  -HYPERLIPIDEMIA: Hyperlipidemia means high cholesterol. Your cholesterol levels are well controlled with Pravastatin, so continue taking it as prescribed.  -HYPOTHYROIDISM: Hypothyroidism means your thyroid is underactive. Your thyroid levels are stable with Levothyroxine daily, so continue taking it as prescribed.  -OSTEOPOROSIS: Osteoporosis means your bones are weak and more likely to break. Continue taking Evenity as it is helping to manage your condition.  -ANXIETY: Anxiety can cause feelings of worry or stress. We are starting you on Citalopram 10mg  daily to help manage your anxiety and monitor for improvement in your symptoms and blood pressure.  -GENERAL HEALTH MAINTENANCE: Continue monitoring your weight, as it is low but normal for you. We will follow up to monitor your blood pressure and response to Citalopram.  INSTRUCTIONS:  Please continue taking your medications as prescribed. We will follow up to monitor your blood pressure and response to the new anxiety medication, Citalopram. If you experience any new symptoms or have concerns, please contact our office.

## 2023-03-10 DIAGNOSIS — Z1211 Encounter for screening for malignant neoplasm of colon: Secondary | ICD-10-CM | POA: Insufficient documentation

## 2023-03-10 DIAGNOSIS — Z23 Encounter for immunization: Secondary | ICD-10-CM | POA: Insufficient documentation

## 2023-03-10 DIAGNOSIS — F411 Generalized anxiety disorder: Secondary | ICD-10-CM | POA: Insufficient documentation

## 2023-03-10 HISTORY — DX: Generalized anxiety disorder: F41.1

## 2023-03-10 HISTORY — DX: Encounter for immunization: Z23

## 2023-03-10 NOTE — Assessment & Plan Note (Signed)
On Evenity. No recent fractures despite physical trauma. Previous adverse reaction (hives) has resolved. -Continue Evenity.

## 2023-03-10 NOTE — Assessment & Plan Note (Signed)
Recent stressors contributing to possible anxiety. This may be contributing to elevated blood pressure readings. -Start Citalopram 10mg  daily. Monitor for improvement in anxiety symptoms and blood pressure control.

## 2023-03-10 NOTE — Assessment & Plan Note (Signed)
Continue pravastatin and aspirin.

## 2023-03-21 ENCOUNTER — Ambulatory Visit: Payer: Medicare Other

## 2023-03-21 DIAGNOSIS — M818 Other osteoporosis without current pathological fracture: Secondary | ICD-10-CM

## 2023-03-21 MED ORDER — ROMOSOZUMAB-AQQG 105 MG/1.17ML ~~LOC~~ SOSY
210.0000 mg | PREFILLED_SYRINGE | Freq: Once | SUBCUTANEOUS | Status: AC
  Administered 2023-03-21: 210 mg via SUBCUTANEOUS

## 2023-03-21 NOTE — Progress Notes (Signed)
   Patient: Barbara Duncan  DOB: 28-Feb-1951  MRN: [id number]    Visit Date: 03/21/2023    Miley Gygi presents today for her monthly Evenity injection.  2 x 105 mg/1.17 ml Single-Dose Prefilled Syringes were given SQ, one in each arm.  Patient tolerated the injection well and has no questions.  The next Evenity injections will be due in one month.      Jomarie Longs, CMA

## 2023-03-26 NOTE — Progress Notes (Addendum)
Subjective:  Patient ID: Barbara Duncan, female    DOB: 1951-02-05  Age: 72 y.o. MRN: [id number]  Chief Complaint  Patient presents with   Hypertension    3 week follow up    Hypertension: Patient is here for 3 week follow up Blood pressure check.  The patient, with a history of hypertension, presents with persistently elevated blood pressure despite current therapy. She takes her blood pressure medication in the morning, not immediately upon waking, and also takes thyroid medication. She reports occasional shortness of breath, particularly with exertion, but it is not constant. She has not seen her cardiologist, Dr. Dulce Sellar in a while as he was the one that prescribed her Verapamil. She denies any headaches, chest pain or swelling in her legs.  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 She recently hosted a family party and has another scheduled soon. She also recently experienced the loss of her sister, which has been emotionally challenging. She is currently in the process of selling her family home, which may be contributing to her stress. Despite these stressors, she reports no trouble sleeping.  BP log over the last 3 weeks, (low 115/76 and high of 151/68)   Anxiety: The patient has been taking an anxiety medication for three weeks, but has not noticed any improvement in her symptoms. Denies any side effects.      03/27/2023    9:37 AM 11/14/2022    8:21 AM 08/17/2022    1:44 PM 06/22/2022    9:36 AM 05/15/2022    8:57 AM  Depression screen PHQ 2/9  Decreased Interest 0 0 0 0 0  Down, Depressed, Hopeless 0 0 0 0 0  PHQ - 2 Score 0 0 0 0 0  Altered sleeping 0 0 0    Tired, decreased energy 0 0 0    Change in appetite 0 0 0    Feeling bad or failure about yourself  0 0 0    Trouble concentrating 0 0 0    Moving slowly or fidgety/restless 0 0 0    Suicidal thoughts 0 0 0    PHQ-9 Score 0 0 0    Difficult doing work/chores Not  difficult at all Not difficult at all Not difficult at all          03/27/2023    9:37 AM  Fall Risk   Falls in the past year? 0  Number falls in past yr: 0  Injury with Fall? 0  Risk for fall due to : No Fall Risks  Follow up Falls evaluation completed    Patient Care Team: Blane Ohara, MD as PCP - General (Family Medicine) Illene Bolus as Referring Physician (Obstetrics and Gynecology)   Review of Systems  Constitutional:  Negative for appetite change, fatigue and fever.  HENT:  Negative for congestion, ear pain, sinus pressure and sore throat.   Respiratory:  Positive for shortness of breath (excertion). Negative for cough, chest tightness and wheezing.   Cardiovascular:  Negative for chest pain and palpitations.  Gastrointestinal:  Negative for abdominal pain, constipation, diarrhea, nausea and vomiting.  Genitourinary:  Negative for dysuria and hematuria.  Musculoskeletal:  Negative for arthralgias, back pain, joint swelling and myalgias.  Skin:  Negative for rash.  Neurological:  Negative for dizziness, weakness and headaches.  Psychiatric/Behavioral:  Positive for dysphoric mood and sleep disturbance. The patient is not nervous/anxious.     Current Outpatient Medications  on File Prior to Visit  Medication Sig Dispense Refill   aspirin EC 81 MG tablet Take 1 tablet (81 mg total) by mouth daily. 90 tablet 3   bisacodyl (DULCOLAX) 5 MG EC tablet Take 5 mg by mouth daily as needed for moderate constipation.     citalopram (CELEXA) 10 MG tablet Take 1 tablet (10 mg total) by mouth daily. 30 tablet 2   levothyroxine (SYNTHROID) 25 MCG tablet Take 1 tablet (25 mcg total) by mouth daily. 90 tablet 2   pantoprazole (PROTONIX) 40 MG tablet TAKE ONE TABLET BY MOUTH ONCE DAILY 90 tablet 2   pravastatin (PRAVACHOL) 20 MG tablet TAKE ONE TABLET BY MOUTH EVERY DAY 90 tablet 1   Romosozumab-aqqg (EVENITY) 105 MG/1. SOSY injection Inject 210 mg into the skin once.     verapamil  (CALAN-SR) 180 MG CR tablet TAKE ONE TABLET BY MOUTH EVERY MORNING 90 tablet 1   No current facility-administered medications on file prior to visit.   Past Medical History:  Diagnosis Date   Acquired hypothyroidism    Anemia    Aortic atherosclerosis (HCC)    Asthma    Dyslipidemia    Essential hypertension 07/05/2015   Hypothyroidism 06/25/2019   Mixed hyperlipidemia 06/25/2019   Osteoporosis    Stroke (cerebrum) (HCC)    Syncope    Vitamin B12 deficiency (non anemic) 06/17/2019   Past Surgical History:  Procedure Laterality Date   CARDIAC CATHETERIZATION     CHOLECYSTECTOMY      Family History  Problem Relation Age of Onset   Hypertension Mother    Hyperlipidemia Mother    Hypertension Father    Hyperlipidemia Father    Social History   Socioeconomic History   Marital status: Married    Spouse name: Not on file   Number of children: Not on file   Years of education: Not on file   Highest education level: Not on file  Occupational History   Not on file  Tobacco Use   Smoking status: Never   Smokeless tobacco: Never  Vaping Use   Vaping status: Never Used  Substance and Sexual Activity   Alcohol use: Never   Drug use: Never   Sexual activity: Not Currently  Other Topics Concern   Not on file  Social History Narrative   Not on file   Social Drivers of Health   Financial Resource Strain: Low Risk  (06/22/2022)   Overall Financial Resource Strain (CARDIA)    Difficulty of Paying Living Expenses: Not hard at all  Food Insecurity: No Food Insecurity (06/22/2022)   Hunger Vital Sign    Worried About Running Out of Food in the Last Year: Never true    Ran Out of Food in the Last Year: Never true  Transportation Needs: No Transportation Needs (06/22/2022)   PRAPARE - Administrator, Civil Service (Medical): No    Lack of Transportation (Non-Medical): No  Physical Activity: Inactive (09/25/2022)   Exercise Vital Sign    Days of Exercise per Week: 0 days     Minutes of Exercise per Session: 0 min  Stress: No Stress Concern Present (09/25/2022)   [employer] of Occupational Health - Occupational Stress Questionnaire    Feeling of Stress : Not at all  Social Connections: Socially Integrated (06/22/2022)   Social Connection and Isolation Panel [NHANES]    Frequency of Communication with Friends and Family: More than three times a week    Frequency of Social Gatherings with  Friends and Family: Twice a week    Attends Religious Services: More than 4 times per year    Active Member of [employer] or Organizations: Yes    Attends Engineer, structural: More than 4 times per year    Marital Status: Married    Objective:  BP (!) 162/68 (BP Location: Left Arm, Patient Position: Sitting)   Pulse 71   Temp 97.8 F (36.6 C) (Temporal)   Ht 5' (1.524 m)   Wt 91 lb (41.3 kg)   SpO2 99%   BMI 17.77 kg/m      03/27/2023    9:35 AM 03/06/2023    9:46 AM 03/06/2023    9:34 AM  BP/Weight  Systolic BP 162 150 148  Diastolic BP 68 72 78  Wt. (Lbs) 91  89.6  BMI 17.77 kg/m2  17.5 kg/m2    Physical Exam Constitutional:      General: She is not in acute distress.    Appearance: Normal appearance. She is not ill-appearing.  Eyes:     Conjunctiva/sclera: Conjunctivae normal.  Neck:     Vascular: No carotid bruit.  Cardiovascular:     Rate and Rhythm: Normal rate and regular rhythm.     Heart sounds: Normal heart sounds.  Pulmonary:     Effort: Pulmonary effort is normal.     Breath sounds: Normal breath sounds. No wheezing.  Skin:    General: Skin is warm.  Neurological:     Mental Status: She is alert and oriented to person, place, and time.  Psychiatric:        Mood and Affect: Mood normal.        Behavior: Behavior normal.     Lab Results  Component Value Date   WBC 8.3 11/14/2022   HGB 13.2 11/14/2022   HCT 39.1 11/14/2022   PLT 302 11/14/2022   GLUCOSE 79 11/14/2022   CHOL 176 11/14/2022   TRIG 53 11/14/2022    HDL 83 11/14/2022   LDLCALC 82 11/14/2022   ALT 18 11/14/2022   AST 20 11/14/2022   NA 139 11/14/2022   K 5.3 (H) 11/14/2022   CL 102 11/14/2022   CREATININE 0.96 11/14/2022   BUN 13 11/14/2022   CO2 23 11/14/2022   TSH 2.270 05/15/2022      Assessment & Plan:    Essential hypertension Assessment & Plan: Elevated blood pressure readings and mild shortness of breath on exertion. No headaches or leg swelling. Elevated blood pressure in the office today 162/68  - BP log from home has mostly 140's/70's - Continue current antihypertensive medication (Verapamil 180mg  daily). - Start Valsartan 40mg  daily in the morning with thyroid medication.   - Continue to keep a BP log - FU in 2-3 weeks - Referral to Dr. Dulce Sellar office since it has been over 3 years since she was last seen  Orders: -     Valsartan; Take 1 tablet (40 mg total) by mouth daily.  Dispense: 90 tablet; Refill: 3 -     Ambulatory referral to Cardiology  GAD (generalized anxiety disorder) Assessment & Plan: Patient has been experiencing stress related to the passing of her sister and the selling of her family home. She has been on an anxiety medication for 3 weeks with no perceived benefit yet.   - Recommend continuing Citalopram 10mg  daily or another 2-3 weeks  - Monitor for improvement in anxiety symptoms  - Fu in 2-3 weeks      Meds ordered this  encounter  Medications   valsartan (DIOVAN) 40 MG tablet    Sig: Take 1 tablet (40 mg total) by mouth daily.    Dispense:  90 tablet    Refill:  3    Orders Placed This Encounter  Procedures   Ambulatory referral to Cardiology     Follow-up: Return in about 3 weeks (around 04/17/2023) for med check, BP recheck.    An After Visit Summary was printed and given to the patient.  Total time spent on today's visit was 32 minutes, including both face-to-face time and nonface-to-face time personally spent on review of chart (labs and imaging), discussing labs and  goals, discussing further work-up, treatment options, referrals to specialist if needed, reviewing outside records if pertinent, answering patient's questions, and coordinating care.   Lajuana Matte, FNP [facility] [phone]

## 2023-03-27 ENCOUNTER — Encounter: Payer: Self-pay | Admitting: Family Medicine

## 2023-03-27 ENCOUNTER — Telehealth: Payer: Self-pay | Admitting: Family Medicine

## 2023-03-27 ENCOUNTER — Ambulatory Visit (INDEPENDENT_AMBULATORY_CARE_PROVIDER_SITE_OTHER): Payer: Medicare Other | Admitting: Family Medicine

## 2023-03-27 VITALS — BP 162/68 | HR 71 | Temp 97.8°F | Ht 60.0 in | Wt 91.0 lb

## 2023-03-27 DIAGNOSIS — F411 Generalized anxiety disorder: Secondary | ICD-10-CM

## 2023-03-27 DIAGNOSIS — I10 Essential (primary) hypertension: Secondary | ICD-10-CM | POA: Diagnosis not present

## 2023-03-27 MED ORDER — VALSARTAN 40 MG PO TABS: 40.0000 mg | ORAL_TABLET | Freq: Every day | ORAL | 3 refills | Status: DC

## 2023-03-27 NOTE — Assessment & Plan Note (Addendum)
Patient has been experiencing stress related to the passing of her sister and the selling of her family home. She has been on an anxiety medication for 3 weeks with no perceived benefit yet.   - Recommend continuing Citalopram 10mg  daily or another 2-3 weeks  - Monitor for improvement in anxiety symptoms  - Fu in 2-3 weeks

## 2023-03-27 NOTE — Telephone Encounter (Signed)
pt called dr. Dulce Sellar office and made an appt for 04/23/23 but she was told she would need a referral since its been over 3 years.

## 2023-03-27 NOTE — Addendum Note (Signed)
Addended by: Renne Crigler on: 03/27/2023 10:58 AM   Modules accepted: Orders

## 2023-03-27 NOTE — Assessment & Plan Note (Addendum)
Elevated blood pressure readings and mild shortness of breath on exertion. No headaches or leg swelling. Elevated blood pressure in the office today 162/68  - BP log from home has mostly 140's/70's - Continue current antihypertensive medication (Verapamil 180mg  daily). - Start Valsartan 40mg  daily in the morning with thyroid medication.   - Continue to keep a BP log - FU in 2-3 weeks - Referral to Dr. Dulce Sellar office since it has been over 3 years since she was last seen

## 2023-04-16 ENCOUNTER — Encounter: Payer: Self-pay | Admitting: Family Medicine

## 2023-04-16 ENCOUNTER — Ambulatory Visit: Payer: Medicare Other | Admitting: Family Medicine

## 2023-04-16 VITALS — BP 138/82 | HR 62 | Temp 97.8°F | Resp 16 | Ht 61.0 in | Wt 92.4 lb

## 2023-04-16 DIAGNOSIS — I10 Essential (primary) hypertension: Secondary | ICD-10-CM | POA: Diagnosis not present

## 2023-04-16 NOTE — Assessment & Plan Note (Signed)
 Patient tolerating the new BP medication despite one episode of near syncope with exertion and low blood pressure reading (89/51) on 04/06/2023. No associated chest pain. Some shortness of breath. Resolved with rest.  BP Readings from Last 3 Encounters:  04/16/23 138/82  03/27/23 (!) 162/68  03/06/23 (!) 150/72    - BP log from home ranges from 100/70 to 148/64 - Continue current antihypertensive medication (Verapamil  180mg  daily). - Continue the Valsartan  40mg  daily in the morning with thyroid  medication.   - Continue to keep a BP log - Encouraged to reduce meat, dairy and incorporate more of a mediterranean diet. - Appointment scheduled with Dr. Monetta on 04/22/22

## 2023-04-16 NOTE — Patient Instructions (Signed)
Aim to do some physical activity for 150 minutes per week. This is typically divided into 5 days per week, 30 minutes per day. The activity should be enough to get your heart rate up. Anything is better than nothing if you have time constraints.    Mediterranean Diet A Mediterranean diet is based on the traditions of countries on the [employer]. It focuses on eating more: Fruits and vegetables. Whole grains, beans, nuts, and seeds. Heart-healthy fats. These are fats that are good for your heart. It involves eating less: Dairy. Meat and eggs. Processed foods with added sugar, salt, and fat. This type of diet can help prevent certain conditions. It can also improve outcomes if you have a long-term (chronic) disease, such as kidney or heart disease. What are tips for following this plan? Reading food labels Check packaged foods for: The serving size. For foods such as rice and pasta, the serving size is the amount of cooked product, not dry. The total fat. Avoid foods with saturated fat or trans fat. Added sugars, such as corn syrup. Shopping  Try to have a balanced diet. Buy a variety of foods, such as: Fresh fruits and vegetables. You may be able to get these from local farmers markets. You can also buy them frozen. Grains, beans, nuts, and seeds. Some of these can be bought in bulk. Fresh seafood. Poultry and eggs. Low-fat dairy products. Buy whole ingredients instead of foods that have already been packaged. If you can't get fresh seafood, buy precooked frozen shrimp or canned fish, such as tuna, salmon, or sardines. Stock your pantry so you always have certain foods on hand, such as olive oil, canned tuna, canned tomatoes, rice, pasta, and beans. Cooking Cook foods with extra-virgin olive oil instead of using butter or other vegetable oils. Have meat as a side dish. Have vegetables or grains as your main dish. This means having meat in small portions or adding small amounts of  meat to foods like pasta or stew. Use beans or vegetables instead of meat in common dishes like chili or lasagna. Try out different cooking methods. Try roasting, broiling, steaming, and sauting vegetables. Add frozen vegetables to soups, stews, pasta, or rice. Add nuts or seeds for added healthy fats and plant protein at each meal. You can add these to yogurt, salads, or vegetable dishes. Marinate fish or vegetables using olive oil, lemon juice, garlic, and fresh herbs. Meal planning Plan to eat a vegetarian meal one day each week. Try to work up to two vegetarian meals, if possible. Eat seafood two or more times a week. Have healthy snacks on hand. These may include: Vegetable sticks with hummus. Greek yogurt. Fruit and nut trail mix. Eat balanced meals. These should include: Fruit: 2-3 servings a day. Vegetables: 4-5 servings a day. Low-fat dairy: 2 servings a day. Fish, poultry, or lean meat: 1 serving a day. Beans and legumes: 2 or more servings a week. Nuts and seeds: 1-2 servings a day. Whole grains: 6-8 servings a day. Extra-virgin olive oil: 3-4 servings a day. Limit red meat and sweets to just a few servings a month. Lifestyle  Try to cook and eat meals with your family. Drink enough fluid to keep your pee (urine) pale yellow. Be active every day. This includes: Aerobic exercise, which is exercise that causes your heart to beat faster. Examples include running and swimming. Leisure activities like gardening, walking, or housework. Get 7-8 hours of sleep each night. Drink red wine if your provider  says you can. A glass of wine is 5 oz (150 mL). You may be allowed to have: Up to 1 glass a day if you're female and not pregnant. Up to 2 glasses a day if you're female. What foods should I eat? Fruits Apples. Apricots. Avocado. Berries. Bananas. Cherries. Dates. Figs. Grapes. Lemons. Melon. Oranges. Peaches. Plums. Pomegranate. Vegetables Artichokes. Beets. Broccoli. Cabbage.  Carrots. Eggplant. Green beans. Chard. Kale. Spinach. Onions. Leeks. Peas. Squash. Tomatoes. Peppers. Radishes. Grains Whole-grain pasta. Brown rice. Bulgur wheat. Polenta. Couscous. Whole-wheat bread. Orpah Cobb. Meats and other proteins Beans. Almonds. Sunflower seeds. Pine nuts. Peanuts. Cod. Salmon. Scallops. Shrimp. Tuna. Tilapia. Clams. Oysters. Eggs. Chicken or [place] without skin. Dairy Low-fat milk. Cheese. Greek yogurt. Fats and oils Extra-virgin olive oil. Avocado oil. Grapeseed oil. Beverages Water. Red wine. Herbal tea. Sweets and desserts Greek yogurt with honey. Baked apples. Poached pears. Trail mix. Seasonings and condiments Basil. Cilantro. Coriander. Cumin. Mint. Parsley. Sage. Rosemary. Tarragon. Garlic. Oregano. Thyme. Pepper. Balsamic vinegar. Tahini. Hummus. Tomato sauce. Olives. Mushrooms. The items listed above may not be all the foods and drinks you can have. Talk to a dietitian to learn more. What foods should I limit? This is a list of foods that should be eaten rarely. Fruits Fruit canned in syrup. Vegetables Deep-fried potatoes, like [place] fries. Grains Packaged pasta or rice dishes. Cereal with added sugar. Snacks with added sugar. Meats and other proteins Beef. Pork. Lamb. Chicken or [place] with skin. Hot dogs. Tomasa Blase. Dairy Ice cream. Sour cream. Whole milk. Fats and oils Butter. Canola oil. Vegetable oil. Beef fat (tallow). Lard. Beverages Juice. Sugar-sweetened soft drinks. Beer. Liquor and spirits. Sweets and desserts Cookies. Cakes. Pies. Candy. Seasonings and condiments Mayonnaise. Pre-made sauces and marinades. The items listed above may not be all the foods and drinks you should limit. Talk to a dietitian to learn more. Where to find more information American Heart Association (AHA): heart.org This information is not intended to replace advice given to you by your health care provider. Make sure you discuss any questions you have  with your health care provider. Document Revised: 07/09/2022 Document Reviewed: 07/09/2022 Elsevier Patient Education  2024 [employer].

## 2023-04-16 NOTE — Progress Notes (Signed)
 Subjective:  Patient ID: Barbara Duncan, female    DOB: March 29, 1951  Age: 73 y.o. MRN: [id number]  Chief Complaint  Patient presents with   Medical Management of Chronic Issues    HPI Hypertension: Patient presents today for follow up on elevated blood pressure and recent medication adjustment. The patient has not had any side effects from the new medication with the exception of one episode of feeling like she was going to pass out after exerting herself by repeatedly going up and down stairs. She sat down and the feeling subsided. She checked her blood pressure afterwards and it had dropped to 87/51. She also reports that her heart will sometimes flutter if she exerts herself too much. She has a follow-up appointment with her cardiologist on 04/23/23 with Dr. Monetta. She also reports a history of stroke in 2017, which was attributed to hormone pills she was taking at the time. She has since stopped taking the hormone pills. She is currently taking Verapamil  180 mg and Valsartan  40 mg daily.   BP log 03/27/23 - 04/16/23      04/16/2023    9:11 AM 03/27/2023    9:37 AM 11/14/2022    8:21 AM 08/17/2022    1:44 PM 06/22/2022    9:36 AM  Depression screen PHQ 2/9  Decreased Interest 0 0 0 0 0  Down, Depressed, Hopeless 0 0 0 0 0  PHQ - 2 Score 0 0 0 0 0  Altered sleeping 0 0 0 0   Tired, decreased energy 1 0 0 0   Change in appetite 0 0 0 0   Feeling bad or failure about yourself  0 0 0 0   Trouble concentrating 0 0 0 0   Moving slowly or fidgety/restless 0 0 0 0   Suicidal thoughts 0 0 0 0   PHQ-9 Score 1 0 0 0   Difficult doing work/chores Not difficult at all Not difficult at all Not difficult at all Not difficult at all         04/16/2023    9:11 AM  Fall Risk   Falls in the past year? 0  Number falls in past yr: 0  Injury with Fall? 0  Risk for fall due to : No Fall Risks  Follow up Falls evaluation completed    Patient Care Team: Sherre Clapper, MD as PCP - General (Family  Medicine) Lenon Dames as Referring Physician (Obstetrics and Gynecology)   Review of Systems  Constitutional:  Negative for appetite change, chills, diaphoresis, fatigue and fever.  HENT:  Negative for congestion, ear pain and sinus pain.   Respiratory:  Negative for cough and shortness of breath.   Cardiovascular:  Positive for palpitations (04/06/23). Negative for chest pain.  Gastrointestinal:  Negative for abdominal pain, constipation, nausea and vomiting.  Genitourinary:  Negative for dysuria.  Musculoskeletal:  Negative for arthralgias.  Neurological:  Negative for weakness and headaches.  Psychiatric/Behavioral:  Negative for dysphoric mood. The patient is not nervous/anxious.     Current Outpatient Medications on File Prior to Visit  Medication Sig Dispense Refill   aspirin  EC 81 MG tablet Take 1 tablet (81 mg total) by mouth daily. 90 tablet 3   bisacodyl (DULCOLAX) 5 MG EC tablet Take 5 mg by mouth daily as needed for moderate constipation.     citalopram  (CELEXA ) 10 MG tablet Take 1 tablet (10 mg total) by mouth daily. 30 tablet 2   levothyroxine  (SYNTHROID ) 25 MCG tablet Take 1  tablet (25 mcg total) by mouth daily. 90 tablet 2   pantoprazole (PROTONIX) 40 MG tablet TAKE ONE TABLET BY MOUTH ONCE DAILY 90 tablet 2   pravastatin  (PRAVACHOL ) 20 MG tablet TAKE ONE TABLET BY MOUTH EVERY DAY 90 tablet 1   Romosozumab -aqqg (EVENITY ) 105 MG/1.17ML SOSY injection Inject 210 mg into the skin once.     valsartan  (DIOVAN ) 40 MG tablet Take 1 tablet (40 mg total) by mouth daily. 90 tablet 3   verapamil  (CALAN -SR) 180 MG CR tablet TAKE ONE TABLET BY MOUTH EVERY MORNING 90 tablet 1   No current facility-administered medications on file prior to visit.   Past Medical History:  Diagnosis Date   Acquired hypothyroidism    Anemia    Aortic atherosclerosis (HCC)    Asthma    Dyslipidemia    Essential hypertension 07/05/2015   Hypothyroidism 06/25/2019   Mixed hyperlipidemia 06/25/2019    Osteoporosis    Stroke (cerebrum) (HCC)    Syncope    Vitamin B12 deficiency (non anemic) 06/17/2019   Past Surgical History:  Procedure Laterality Date   CARDIAC CATHETERIZATION     CHOLECYSTECTOMY      Family History  Problem Relation Age of Onset   Hypertension Mother    Hyperlipidemia Mother    Hypertension Father    Hyperlipidemia Father    Social History   Socioeconomic History   Marital status: Married    Spouse name: Not on file   Number of children: Not on file   Years of education: Not on file   Highest education level: Not on file  Occupational History   Not on file  Tobacco Use   Smoking status: Never   Smokeless tobacco: Never  Vaping Use   Vaping status: Never Used  Substance and Sexual Activity   Alcohol use: Never   Drug use: Never   Sexual activity: Not Currently  Other Topics Concern   Not on file  Social History Narrative   Not on file   Social Drivers of Health   Financial Resource Strain: Low Risk  (06/22/2022)   Overall Financial Resource Strain (CARDIA)    Difficulty of Paying Living Expenses: Not hard at all  Food Insecurity: No Food Insecurity (06/22/2022)   Hunger Vital Sign    Worried About Running Out of Food in the Last Year: Never true    Ran Out of Food in the Last Year: Never true  Transportation Needs: No Transportation Needs (06/22/2022)   PRAPARE - Administrator, Civil Service (Medical): No    Lack of Transportation (Non-Medical): No  Physical Activity: Inactive (09/25/2022)   Exercise Vital Sign    Days of Exercise per Week: 0 days    Minutes of Exercise per Session: 0 min  Stress: No Stress Concern Present (09/25/2022)   [employer] of Occupational Health - Occupational Stress Questionnaire    Feeling of Stress : Not at all  Social Connections: Socially Integrated (06/22/2022)   Social Connection and Isolation Panel [NHANES]    Frequency of Communication with Friends and Family: More than three times a week     Frequency of Social Gatherings with Friends and Family: Twice a week    Attends Religious Services: More than 4 times per year    Active Member of [employer] or Organizations: Yes    Attends Engineer, Structural: More than 4 times per year    Marital Status: Married    Objective:  BP 138/82 (BP Location: Right Arm,  Patient Position: Sitting, Cuff Size: Small)   Pulse 62   Temp 97.8 F (36.6 C) (Temporal)   Resp 16   Ht 5' 1 (1.549 m)   Wt 92 lb 6.4 oz (41.9 kg)   SpO2 98%   BMI 17.46 kg/m      04/16/2023    9:08 AM 03/27/2023    9:35 AM 03/06/2023    9:46 AM  BP/Weight  Systolic BP 138 162 150  Diastolic BP 82 68 72  Wt. (Lbs) 92.4 91   BMI 17.46 kg/m2 17.77 kg/m2     Physical Exam Constitutional:      General: She is not in acute distress.    Appearance: Normal appearance.  Eyes:     Conjunctiva/sclera: Conjunctivae normal.  Cardiovascular:     Rate and Rhythm: Normal rate and regular rhythm.     Heart sounds: Normal heart sounds. No murmur heard. Pulmonary:     Effort: Pulmonary effort is normal.     Breath sounds: Normal breath sounds. No wheezing.  Musculoskeletal:        General: Normal range of motion.     Cervical back: Normal range of motion.  Skin:    General: Skin is warm.  Neurological:     Mental Status: She is alert. Mental status is at baseline.  Psychiatric:        Mood and Affect: Mood normal.        Behavior: Behavior normal.      Lab Results  Component Value Date   WBC 8.3 11/14/2022   HGB 13.2 11/14/2022   HCT 39.1 11/14/2022   PLT 302 11/14/2022   GLUCOSE 79 11/14/2022   CHOL 176 11/14/2022   TRIG 53 11/14/2022   HDL 83 11/14/2022   LDLCALC 82 11/14/2022   ALT 18 11/14/2022   AST 20 11/14/2022   NA 139 11/14/2022   K 5.3 (H) 11/14/2022   CL 102 11/14/2022   CREATININE 0.96 11/14/2022   BUN 13 11/14/2022   CO2 23 11/14/2022   TSH 2.270 05/15/2022      Assessment & Plan:    Essential hypertension Assessment &  Plan: Patient tolerating the new BP medication despite one episode of near syncope with exertion and low blood pressure reading (89/51) on 04/06/2023. No associated chest pain. Some shortness of breath. Resolved with rest.  BP Readings from Last 3 Encounters:  04/16/23 138/82  03/27/23 (!) 162/68  03/06/23 (!) 150/72    - BP log from home ranges from 100/70 to 148/64 - Continue current antihypertensive medication (Verapamil  180mg  daily). - Continue the Valsartan  40mg  daily in the morning with thyroid  medication.   - Continue to keep a BP log - Encouraged to reduce meat, dairy and incorporate more of a mediterranean diet. - Appointment scheduled with Dr. Monetta on 04/22/22      No orders of the defined types were placed in this encounter.   No orders of the defined types were placed in this encounter.    Follow-up: Return for Keep appointment with Dr. Sherre in March, chronic.   I,Angela Taylor,acting as a neurosurgeon for Harrie CHRISTELLA Cedar, FNP.,have documented all relevant documentation on the behalf of Harrie CHRISTELLA Cedar, FNP,as directed by  Harrie CHRISTELLA Cedar, FNP while in the presence of Harrie CHRISTELLA Cedar, FNP.   An After Visit Summary was printed and given to the patient.  Total time spent on today's visit was 22 minutes, including both face-to-face time and nonface-to-face time personally spent on review  of chart (labs and imaging), discussing labs and goals, discussing further work-up, treatment options, referrals to specialist if needed, reviewing outside records if pertinent, answering patient's questions, and coordinating care.    Harrie Cedar, FNP [facility] [phone]

## 2023-04-17 ENCOUNTER — Telehealth: Payer: Self-pay

## 2023-04-17 NOTE — Telephone Encounter (Signed)
   Patient: Barbara Duncan  DOB: 05/10/1950  MRN: [id number]  Evenity  benefits verified by [employer] on: 04/13/2023 Coverage Available: Buy & Bill Prior authorization NOT REQUIRED Deductible: $0 of $257 met OOP Cost: $0 Admin Fee: $0 Benefit Details: : Benefits subject to a $257.00 deductible ($0.00 met) and 20.0%% co-insurance for the administration and cost of Evenity .  For the Secondary MD Purchase access option, this is a Medicare Supplement Plan F and it covers the Medicare Part B deductible, co-insurance and 100% of the excess charges.   Patient started Evenity  06/14/2022 and should transition to Prolia  in March 2025.  Suzen CHRISTELLA Sharps, LPN

## 2023-04-19 NOTE — Progress Notes (Signed)
 Cardiology Office Note:    Date:  04/23/2023   ID:  Barbara Duncan, DOB 1950/12/02, MRN [id number]  PCP:  Sherre Clapper, MD  Cardiologist:  Redell Leiter, MD   Referring MD: Sherre Clapper, MD  ASSESSMENT:    1. Essential hypertension   2. Palpitations   3. Near syncope   4. Dyslipidemia    PLAN:    In order of problems listed above:  In order to make decision regarding drug therapy I asked her to purchase a new validated blood pressure device and educated technique she will do twice daily blood pressures 2 weeks and send a list to me through MyChart.  At this time with the home measurements she provides a intensify her antihypertensive treatment With a single kidney check renal vascular duplex and also for completeness and aldosterone renin level Presently not having palpitation no further episodes of lightheadedness I do not think she requires an event monitor or cardiac imaging at this time Continue her statin with aortic atherosclerosis  Next appointment 6 weeks   Medication Adjustments/Labs and Tests Ordered: Current medicines are reviewed at length with the patient today.  Concerns regarding medicines are outlined above.  Orders Placed This Encounter  Procedures   EKG 12-Lead   Meds ordered this encounter  Medications   valsartan  (DIOVAN ) 40 MG tablet    Sig: Take 1 tablet (40 mg total) by mouth daily.    Dispense:  90 tablet    Refill:  3     Chief complaint my blood pressure is quite variable  History of Present Illness:    Barbara Duncan is a 73 y.o. female with a history of of Aortic atherosclerosis stroke hyperlipidemia with statin therapy and hypertension at the request of Cox, Kirsten, MD. he is referred for hypertension.  I reviewed her office note at 04/16/2023 which shows her blood pressures to be at target on her current medications verapamil  and valsartan  but also noted she had an episode of near syncope associated with systolic blood pressure of less than 90.   There was also complaints of palpitation.  She relates her blood pressure has been quite variable her device is old but she gets measurements most recently that tend to run in the 110-130/60-80 range Presently not having palpitation no recurrent episode of lightheadedness She has a history of having a single kidney diagnosed 25 years ago she has stability of the abdomen performed May 2024 which showed aortic atherosclerosis and severe atrophy of the left kidney.  Her GFR is 64 cc/min and her creatinine is normal 0.96 were previously elevated as high as 1.11 with GFR of 54 cc/min. She uses a pillbox for compliance does add salt to her diet.  No alcohol usage does not snore or have any stigmata of sleep apnea does not use over-the-counter nonsteroidal anti-inflammatory drugs. No cardiovascular complaints of edema shortness of breath or chest pain  Past Medical History:  Diagnosis Date   Acquired hypothyroidism    Adult BMI <19 kg/sq m 11/08/2021   Anemia    Aortic atherosclerosis (HCC)    Asthma    B12 deficiency 07/06/2015   Chronic idiopathic constipation 08/03/2022   Dyslipidemia    Encounter for immunization 03/10/2023   Essential hypertension 07/05/2015   GAD (generalized anxiety disorder) 03/10/2023   Hypothyroidism 06/25/2019   Mixed hyperlipidemia 06/25/2019   Osteoporosis    Rash 09/25/2022   Stroke (cerebrum) (HCC)    Stroke (HCC) 07/08/2015   Syncope    Vitamin B12 deficiency (  non anemic) 06/17/2019    Past Surgical History:  Procedure Laterality Date   CARDIAC CATHETERIZATION     CHOLECYSTECTOMY      Current Medications: Current Meds  Medication Sig   aspirin  EC 81 MG tablet Take 1 tablet (81 mg total) by mouth daily.   bisacodyl (DULCOLAX) 5 MG EC tablet Take 5 mg by mouth daily as needed for moderate constipation.   citalopram  (CELEXA ) 10 MG tablet Take 1 tablet (10 mg total) by mouth daily.   levothyroxine  (SYNTHROID ) 25 MCG tablet Take 1 tablet (25 mcg total)  by mouth daily.   pantoprazole (PROTONIX) 40 MG tablet TAKE ONE TABLET BY MOUTH ONCE DAILY   pravastatin  (PRAVACHOL ) 20 MG tablet TAKE ONE TABLET BY MOUTH EVERY DAY   Romosozumab -aqqg (EVENITY ) 105 MG/1.17ML SOSY injection Inject 210 mg into the skin once.   verapamil  (CALAN -SR) 180 MG CR tablet TAKE ONE TABLET BY MOUTH EVERY MORNING   [DISCONTINUED] valsartan  (DIOVAN ) 40 MG tablet Take 1 tablet (40 mg total) by mouth daily.     Allergies:   Patient has no known allergies.   Social History   Socioeconomic History   Marital status: Married    Spouse name: Not on file   Number of children: Not on file   Years of education: Not on file   Highest education level: Not on file  Occupational History   Not on file  Tobacco Use   Smoking status: Never   Smokeless tobacco: Never  Vaping Use   Vaping status: Never Used  Substance and Sexual Activity   Alcohol use: Never   Drug use: Never   Sexual activity: Not Currently  Other Topics Concern   Not on file  Social History Narrative   Not on file   Social Drivers of Health   Financial Resource Strain: Low Risk  (06/22/2022)   Overall Financial Resource Strain (CARDIA)    Difficulty of Paying Living Expenses: Not hard at all  Food Insecurity: No Food Insecurity (06/22/2022)   Hunger Vital Sign    Worried About Running Out of Food in the Last Year: Never true    Ran Out of Food in the Last Year: Never true  Transportation Needs: No Transportation Needs (06/22/2022)   PRAPARE - Administrator, Civil Service (Medical): No    Lack of Transportation (Non-Medical): No  Physical Activity: Inactive (09/25/2022)   Exercise Vital Sign    Days of Exercise per Week: 0 days    Minutes of Exercise per Session: 0 min  Stress: No Stress Concern Present (09/25/2022)   [employer] of Occupational Health - Occupational Stress Questionnaire    Feeling of Stress : Not at all  Social Connections: Socially Integrated (06/22/2022)    Social Connection and Isolation Panel [NHANES]    Frequency of Communication with Friends and Family: More than three times a week    Frequency of Social Gatherings with Friends and Family: Twice a week    Attends Religious Services: More than 4 times per year    Active Member of [employer] or Organizations: Yes    Attends Engineer, Structural: More than 4 times per year    Marital Status: Married     Family History: The patient's family history includes Hyperlipidemia in her father and mother; Hypertension in her father and mother.  ROS:   ROS Please see the history of present illness.     All other systems reviewed and are negative.  EKGs/Labs/Other Studies Reviewed:  The following studies were reviewed today:   EKG Interpretation Date/Time:  Monday April 23 2023 14:43:45 EST Ventricular Rate:  75 PR Interval:  140 QRS Duration:  74 QT Interval:  408 QTC Calculation: 455 R Axis:   72  Text Interpretation: Normal sinus rhythm Minor ST abnormality nonspecific otherwise normal EKG No previous ECGs available Confirmed by Monetta Rogue ([id number]) on 04/23/2023 2:48:06 PM    Recent Labs: 05/15/2022: TSH 2.270 11/14/2022: ALT 18; BUN 13; Creatinine, Ser 0.96; Hemoglobin 13.2; Platelets 302; Potassium 5.3; Sodium 139  Recent Lipid Panel    Component Value Date/Time   CHOL 176 11/14/2022 0839   TRIG 53 11/14/2022 0839   HDL 83 11/14/2022 0839   CHOLHDL 2.1 11/14/2022 0839   LDLCALC 82 11/14/2022 0839    Physical Exam:    VS:  BP (!) 158/82   Pulse 75   Ht 5' (1.524 m)   Wt 91 lb 3.2 oz (41.4 kg)   SpO2 97%   BMI 17.81 kg/m     Wt Readings from Last 3 Encounters:  04/23/23 91 lb 3.2 oz (41.4 kg)  04/16/23 92 lb 6.4 oz (41.9 kg)  03/27/23 91 lb (41.3 kg)     GEN: Small stature well nourished, well developed in no acute distress HEENT: Normal NECK: No JVD; No carotid bruits LYMPHATICS: No lymphadenopathy CARDIAC: RRR, no murmurs, rubs, gallops RESPIRATORY:   Clear to auscultation without rales, wheezing or rhonchi  ABDOMEN: Soft, non-tender, non-distended no renal bruit MUSCULOSKELETAL:  No edema; No deformity  SKIN: Warm and dry NEUROLOGIC:  Alert and oriented x 3 PSYCHIATRIC:  Normal affect     Signed, Rogue Monetta, MD  04/23/2023 3:07 PM    [facility]

## 2023-04-20 ENCOUNTER — Encounter: Payer: Self-pay | Admitting: Cardiology

## 2023-04-20 DIAGNOSIS — D649 Anemia, unspecified: Secondary | ICD-10-CM | POA: Insufficient documentation

## 2023-04-20 DIAGNOSIS — R55 Syncope and collapse: Secondary | ICD-10-CM | POA: Insufficient documentation

## 2023-04-20 DIAGNOSIS — E785 Hyperlipidemia, unspecified: Secondary | ICD-10-CM | POA: Insufficient documentation

## 2023-04-20 DIAGNOSIS — J45909 Unspecified asthma, uncomplicated: Secondary | ICD-10-CM | POA: Insufficient documentation

## 2023-04-23 ENCOUNTER — Ambulatory Visit: Payer: Medicare Other | Attending: Cardiology | Admitting: Cardiology

## 2023-04-23 ENCOUNTER — Encounter: Payer: Self-pay | Admitting: Cardiology

## 2023-04-23 VITALS — BP 158/82 | HR 75 | Ht 60.0 in | Wt 91.2 lb

## 2023-04-23 DIAGNOSIS — I10 Essential (primary) hypertension: Secondary | ICD-10-CM | POA: Insufficient documentation

## 2023-04-23 DIAGNOSIS — E785 Hyperlipidemia, unspecified: Secondary | ICD-10-CM | POA: Insufficient documentation

## 2023-04-23 DIAGNOSIS — R002 Palpitations: Secondary | ICD-10-CM | POA: Diagnosis not present

## 2023-04-23 DIAGNOSIS — R55 Syncope and collapse: Secondary | ICD-10-CM | POA: Insufficient documentation

## 2023-04-23 MED ORDER — VALSARTAN 40 MG PO TABS: 40.0000 mg | ORAL_TABLET | Freq: Every day | ORAL | 3 refills | Status: DC

## 2023-04-23 NOTE — Patient Instructions (Addendum)
 Medication Instructions:  Your physician recommends that you continue on your current medications as directed. Please refer to the Current Medication list given to you today.  *If you need a refill on your cardiac medications before your next appointment, please call your pharmacy*   Lab Work: Your physician recommends that you return for lab work in:   Labs today: Aldosterone/ Renin level  If you have labs (blood work) drawn today and your tests are completely normal, you will receive your results only by: MyChart Message (if you have MyChart) OR A paper copy in the mail If you have any lab test that is abnormal or we need to change your treatment, we will call you to review the results.   Testing/Procedures: Your physician has requested that you have a renal artery duplex. During this test, an ultrasound is used to evaluate blood flow to the kidneys. Allow one hour for this exam. Do not eat after midnight the day before and avoid carbonated beverages. Take your medications as you usually do.    Follow-Up: At [facility], you and your health needs are our priority.  As part of our continuing mission to provide you with exceptional heart care, we have created designated Provider Care Teams.  These Care Teams include your primary Cardiologist (physician) and Advanced Practice Providers (APPs -  Physician Assistants and Nurse Practitioners) who all work together to provide you with the care you need, when you need it.  We recommend signing up for the patient portal called MyChart.  Sign up information is provided on this After Visit Summary.  MyChart is used to connect with patients for Virtual Visits (Telemedicine).  Patients are able to view lab/test results, encounter notes, upcoming appointments, etc.  Non-urgent messages can be sent to your provider as well.   To learn more about what you can do with MyChart, go to [email or web address].    Your next appointment:   6  week(s)  Provider:   Redell Leiter, MD    Other Instructions Purchase a new Omron blood pressure device.   Check blood pressure twice daily for 2 weeks. After 2 weeks send a list of blood pressures to the office via MyChart         [facility]  Tips to measure your blood pressure correctly  To determine whether you have hypertension, a medical professional will take a blood pressure reading. How you prepare for the test, the position of your arm, and other factors can change a blood pressure reading by 10% or more. That could be enough to hide high blood pressure, start you on a drug you don't really need, or lead your doctor to incorrectly adjust your medications. National and international guidelines offer specific instructions for measuring blood pressure. If a doctor, nurse, or medical assistant isn't doing it right, don't hesitate to ask him or her to get with the guidelines. Here's what you can do to ensure a correct reading:  Don't drink a caffeinated beverage or smoke during the 30 minutes before the test.  Sit quietly for five minutes before the test begins.  During the measurement, sit in a chair with your feet on the floor and your arm supported so your elbow is at about heart level.  The inflatable part of the cuff should completely cover at least 80% of your upper arm, and the cuff should be placed on bare skin, not over a shirt.  Don't talk during the measurement.  Have your blood pressure measured twice,  with a brief break in between. If the readings are different by 5 points or more, have it done a third time. There are times to break these rules. If you sometimes feel lightheaded when getting out of bed in the morning or when you stand after sitting, you should have your blood pressure checked while seated and then while standing to see if it falls from one position to the next. Because blood pressure varies throughout the day, your doctor will rarely diagnose hypertension  on the basis of a single reading. Instead, he or she will want to confirm the measurements on at least two occasions, usually within a few weeks of one another. The exception to this rule is if you have a blood pressure reading of 180/110 mm Hg or higher. A result this high usually calls for prompt treatment. It's also a good idea to have your blood pressure measured in both arms at least once, since the reading in one arm (usually the right) may be higher than that in the left. A 2014 study in The American Journal of Medicine of nearly 3,400 people found average arm- to-arm differences in systolic blood pressure of about 5 points. The higher number should be used to make treatment decisions. In 2017, new guidelines from the American Heart Association, the [employer] of Cardiology, and nine other health organizations lowered the diagnosis of high blood pressure to 130/80 mm Hg or higher for all adults. The guidelines also redefined the various blood pressure categories to now include normal, elevated, Stage 1 hypertension, Stage 2 hypertension, and hypertensive crisis (see Blood pressure categories). Blood pressure categories  Blood pressure category SYSTOLIC (upper number)  DIASTOLIC (lower number)  Normal Less than 120 mm Hg and Less than 80 mm Hg  Elevated 120-129 mm Hg and Less than 80 mm Hg  High blood pressure: Stage 1 hypertension 130-139 mm Hg or 80-89 mm Hg  High blood pressure: Stage 2 hypertension 140 mm Hg or higher or 90 mm Hg or higher  Hypertensive crisis (consult your doctor immediately) Higher than 180 mm Hg and/or Higher than 120 mm Hg  Source: American Heart Association and American Stroke Association. For more on getting your blood pressure under control, buy Controlling Your Blood Pressure, a Special Health Report from [facility].

## 2023-04-26 ENCOUNTER — Ambulatory Visit (INDEPENDENT_AMBULATORY_CARE_PROVIDER_SITE_OTHER): Payer: Medicare Other

## 2023-04-26 DIAGNOSIS — M818 Other osteoporosis without current pathological fracture: Secondary | ICD-10-CM

## 2023-04-26 MED ORDER — ROMOSOZUMAB-AQQG 105 MG/1.17ML ~~LOC~~ SOSY
210.0000 mg | PREFILLED_SYRINGE | Freq: Once | SUBCUTANEOUS | Status: AC
  Administered 2023-04-26: 210 mg via SUBCUTANEOUS

## 2023-04-26 NOTE — Progress Notes (Signed)
Patient is here for evenity shot. She tolerated shot well

## 2023-05-01 LAB — ALDOSTERONE + RENIN ACTIVITY W/ RATIO
Aldosterone: <1 ng/dL (ref 0.0–30.0)
Renin Activity, Plasma: 3.847 ng/mL/h (ref 0.167–5.380)

## 2023-05-14 ENCOUNTER — Ambulatory Visit: Payer: Medicare Other | Attending: Cardiology

## 2023-05-14 DIAGNOSIS — E785 Hyperlipidemia, unspecified: Secondary | ICD-10-CM | POA: Insufficient documentation

## 2023-05-14 DIAGNOSIS — R55 Syncope and collapse: Secondary | ICD-10-CM | POA: Diagnosis not present

## 2023-05-14 DIAGNOSIS — R002 Palpitations: Secondary | ICD-10-CM | POA: Diagnosis not present

## 2023-05-14 DIAGNOSIS — I10 Essential (primary) hypertension: Secondary | ICD-10-CM | POA: Insufficient documentation

## 2023-05-28 ENCOUNTER — Ambulatory Visit: Payer: Medicare Other

## 2023-05-29 ENCOUNTER — Ambulatory Visit (INDEPENDENT_AMBULATORY_CARE_PROVIDER_SITE_OTHER): Payer: Medicare Other

## 2023-05-29 DIAGNOSIS — M818 Other osteoporosis without current pathological fracture: Secondary | ICD-10-CM

## 2023-05-29 MED ORDER — ROMOSOZUMAB-AQQG 105 MG/1.17ML ~~LOC~~ SOSY
210.0000 mg | PREFILLED_SYRINGE | Freq: Once | SUBCUTANEOUS | Status: AC
  Administered 2023-05-29: 210 mg via SUBCUTANEOUS

## 2023-05-29 NOTE — Progress Notes (Signed)
   Patient: Barbara Duncan  DOB: 02/25/1951  MRN: [id number]    Visit Date: 05/29/2023    Jazzmyn Filion presents today for her monthly Evenity injection.  2 x 105 mg/1.17 ml Single-Dose Prefilled Syringes were given SQ, one in each arm.  Patient tolerated the injection well and has no questions.  The next Evenity injections will be due in one month.  Administrations This Visit     Romosozumab-aqqg (EVENITY) 105 MG/1. injection 210 mg     Admin Date 05/29/2023 Action Given Dose 210 mg Route Subcutaneous Documented By Precious Reel, CMA              Precious Reel, CMA

## 2023-06-07 ENCOUNTER — Other Ambulatory Visit: Payer: Self-pay | Admitting: Family Medicine

## 2023-06-07 ENCOUNTER — Telehealth: Payer: Self-pay

## 2023-06-07 MED ORDER — ROMOSOZUMAB-AQQG 105 MG/1.17ML ~~LOC~~ SOSY
210.0000 mg | PREFILLED_SYRINGE | SUBCUTANEOUS | Status: DC
Start: 2023-07-07 — End: 2023-06-07

## 2023-06-07 MED ORDER — DENOSUMAB 60 MG/ML ~~LOC~~ SOSY
60.0000 mg | PREFILLED_SYRINGE | Freq: Once | SUBCUTANEOUS | Status: AC
  Administered 2023-06-27: 60 mg via SUBCUTANEOUS

## 2023-06-07 NOTE — Addendum Note (Signed)
 Addended by: Jacklynn Bue on: 06/07/2023 10:40 AM   Modules accepted: Orders

## 2023-06-07 NOTE — Telephone Encounter (Signed)
 LM for patient letting her know that she has completed one year of the Evenity injections and will now be transitioning to the Prolia injection every 6 months - with her first dose starting 06/27/23.

## 2023-06-11 NOTE — Progress Notes (Unsigned)
 Subjective:  Patient ID: Barbara Duncan, female    DOB: 08-13-1950  Age: 73 y.o. MRN: [id number]  Chief Complaint  Patient presents with   Medical Management of Chronic Issues    HPI   The patient, a 73 year old female, presents with follow up on Hypertension, hyperlipidemia.  The patient is also on pravastatin 20 mg daily for cholesterol management, but admits to not eating healthily despite regular walking. She is on citalopram, the latter of which was prescribed for previous panic attacks, which she no longer experiences. She also takes meloxicam for joint pain, which she reports as being manageable, although she has limited mobility in one shoulder.  Osteoporosis: She is taking evenity monthly.   Hypertension:  Patient tolerating Verapamil 180 mg every morning, Aspirin 81 mg daily well without side effects, and valsartan 40 mg daily.  Hypothyroidism: She takes Levothyroxine 25 mcg daily.  GERD: on pantoprazole 40 mg daily.      04/16/2023    9:11 AM 03/27/2023    9:37 AM 11/14/2022    8:21 AM 08/17/2022    1:44 PM 06/22/2022    9:36 AM  Depression screen PHQ 2/9  Decreased Interest 0 0 0 0 0  Down, Depressed, Hopeless 0 0 0 0 0  PHQ - 2 Score 0 0 0 0 0  Altered sleeping 0 0 0 0   Tired, decreased energy 1 0 0 0   Change in appetite 0 0 0 0   Feeling bad or failure about yourself  0 0 0 0   Trouble concentrating 0 0 0 0   Moving slowly or fidgety/restless 0 0 0 0   Suicidal thoughts 0 0 0 0   PHQ-9 Score 1 0 0 0   Difficult doing work/chores Not difficult at all Not difficult at all Not difficult at all Not difficult at all         04/16/2023    9:11 AM  Fall Risk   Falls in the past year? 0  Number falls in past yr: 0  Injury with Fall? 0  Risk for fall due to : No Fall Risks  Follow up Falls evaluation completed    Patient Care Team: Blane Ohara, MD as PCP - General (Family Medicine) Illene Bolus as Referring Physician (Obstetrics and Gynecology)   Review of  Systems  Current Outpatient Medications on File Prior to Visit  Medication Sig Dispense Refill   aspirin EC 81 MG tablet Take 1 tablet (81 mg total) by mouth daily. 90 tablet 3   bisacodyl (DULCOLAX) 5 MG EC tablet Take 5 mg by mouth daily as needed for moderate constipation.     citalopram (CELEXA) 10 MG tablet Take 1 tablet (10 mg total) by mouth daily. 30 tablet 2   levothyroxine (SYNTHROID) 25 MCG tablet Take 1 tablet (25 mcg total) by mouth daily. 90 tablet 2   pantoprazole (PROTONIX) 40 MG tablet TAKE ONE TABLET BY MOUTH ONCE DAILY 90 tablet 2   pravastatin (PRAVACHOL) 20 MG tablet TAKE 1 TABLET BY MOUTH ONCE DAILY. 90 tablet 1   Romosozumab-aqqg (EVENITY) 105 MG/1. SOSY injection Inject 210 mg into the skin once.     valsartan (DIOVAN) 40 MG tablet Take 1 tablet (40 mg total) by mouth daily. 90 tablet 3   verapamil (CALAN-SR) 180 MG CR tablet TAKE ONE TABLET BY MOUTH EVERY MORNING 90 tablet 1   Current Facility-Administered Medications on File Prior to Visit  Medication Dose Route Frequency Provider Last Rate Last  Admin   [START ON 06/27/2023] denosumab (PROLIA) injection 60 mg  60 mg Subcutaneous Once Blane Ohara, MD       Past Medical History:  Diagnosis Date   Acquired hypothyroidism    Adult BMI <19 kg/sq m 11/08/2021   Anemia    Aortic atherosclerosis (HCC)    Asthma    B12 deficiency 07/06/2015   Chronic idiopathic constipation 08/03/2022   Dyslipidemia    Encounter for immunization 03/10/2023   Essential hypertension 07/05/2015   GAD (generalized anxiety disorder) 03/10/2023   Hypothyroidism 06/25/2019   Mixed hyperlipidemia 06/25/2019   Osteoporosis    Rash 09/25/2022   Stroke (cerebrum) (HCC)    Stroke (HCC) 07/08/2015   Syncope    Vitamin B12 deficiency (non anemic) 06/17/2019   Past Surgical History:  Procedure Laterality Date   CARDIAC CATHETERIZATION     CHOLECYSTECTOMY      Family History  Problem Relation Age of Onset   Hypertension Mother     Hyperlipidemia Mother    Hypertension Father    Hyperlipidemia Father    Social History   Socioeconomic History   Marital status: Married    Spouse name: Not on file   Number of children: Not on file   Years of education: Not on file   Highest education level: Not on file  Occupational History   Not on file  Tobacco Use   Smoking status: Never   Smokeless tobacco: Never  Vaping Use   Vaping status: Never Used  Substance and Sexual Activity   Alcohol use: Never   Drug use: Never   Sexual activity: Not Currently  Other Topics Concern   Not on file  Social History Narrative   Not on file   Social Drivers of Health   Financial Resource Strain: Low Risk  (06/22/2022)   Overall Financial Resource Strain (CARDIA)    Difficulty of Paying Living Expenses: Not hard at all  Food Insecurity: No Food Insecurity (06/22/2022)   Hunger Vital Sign    Worried About Running Out of Food in the Last Year: Never true    Ran Out of Food in the Last Year: Never true  Transportation Needs: No Transportation Needs (06/22/2022)   PRAPARE - Administrator, Civil Service (Medical): No    Lack of Transportation (Non-Medical): No  Physical Activity: Inactive (09/25/2022)   Exercise Vital Sign    Days of Exercise per Week: 0 days    Minutes of Exercise per Session: 0 min  Stress: No Stress Concern Present (09/25/2022)   [employer] of Occupational Health - Occupational Stress Questionnaire    Feeling of Stress : Not at all  Social Connections: Socially Integrated (06/22/2022)   Social Connection and Isolation Panel [NHANES]    Frequency of Communication with Friends and Family: More than three times a week    Frequency of Social Gatherings with Friends and Family: Twice a week    Attends Religious Services: More than 4 times per year    Active Member of [employer] or Organizations: Yes    Attends Engineer, structural: More than 4 times per year    Marital Status: Married     Objective:  There were no vitals taken for this visit.     04/23/2023    2:40 PM 04/16/2023    9:08 AM 03/27/2023    9:35 AM  BP/Weight  Systolic BP 158 138 162  Diastolic BP 82 82 68  Wt. (Lbs) 91.2 92.4 91  BMI 17.81 kg/m2 17.46 kg/m2 17.77 kg/m2    Physical Exam  Diabetic Foot Exam - Simple   No data filed      Lab Results  Component Value Date   WBC 8.3 11/14/2022   HGB 13.2 11/14/2022   HCT 39.1 11/14/2022   PLT 302 11/14/2022   GLUCOSE 79 11/14/2022   CHOL 176 11/14/2022   TRIG 53 11/14/2022   HDL 83 11/14/2022   LDLCALC 82 11/14/2022   ALT 18 11/14/2022   AST 20 11/14/2022   NA 139 11/14/2022   K 5.3 (H) 11/14/2022   CL 102 11/14/2022   CREATININE 0.96 11/14/2022   BUN 13 11/14/2022   CO2 23 11/14/2022   TSH 2.270 05/15/2022      Assessment & Plan:    Essential hypertension  Chronic idiopathic constipation  Other specified hypothyroidism  Mixed hyperlipidemia  GAD (generalized anxiety disorder)     No orders of the defined types were placed in this encounter.   No orders of the defined types were placed in this encounter.    Follow-up: No follow-ups on file.   I,Marla I Leal-Borjas,acting as a scribe for Blane Ohara, MD.,have documented all relevant documentation on the behalf of Blane Ohara, MD,as directed by  Blane Ohara, MD while in the presence of Blane Ohara, MD.   An After Visit Summary was printed and given to the patient.  Blane Ohara, MD [facility] [phone]

## 2023-06-12 ENCOUNTER — Ambulatory Visit (INDEPENDENT_AMBULATORY_CARE_PROVIDER_SITE_OTHER): Payer: Medicare Other | Admitting: Family Medicine

## 2023-06-12 ENCOUNTER — Encounter: Payer: Self-pay | Admitting: Family Medicine

## 2023-06-12 VITALS — BP 130/70 | HR 87 | Temp 97.8°F | Ht 61.5 in | Wt 93.0 lb

## 2023-06-12 DIAGNOSIS — K219 Gastro-esophageal reflux disease without esophagitis: Secondary | ICD-10-CM | POA: Diagnosis not present

## 2023-06-12 DIAGNOSIS — I10 Essential (primary) hypertension: Secondary | ICD-10-CM

## 2023-06-12 DIAGNOSIS — K5904 Chronic idiopathic constipation: Secondary | ICD-10-CM

## 2023-06-12 DIAGNOSIS — M818 Other osteoporosis without current pathological fracture: Secondary | ICD-10-CM | POA: Diagnosis not present

## 2023-06-12 DIAGNOSIS — E782 Mixed hyperlipidemia: Secondary | ICD-10-CM | POA: Diagnosis not present

## 2023-06-12 DIAGNOSIS — E038 Other specified hypothyroidism: Secondary | ICD-10-CM | POA: Diagnosis not present

## 2023-06-12 DIAGNOSIS — Z1211 Encounter for screening for malignant neoplasm of colon: Secondary | ICD-10-CM

## 2023-06-12 DIAGNOSIS — I700 Atherosclerosis of aorta: Secondary | ICD-10-CM

## 2023-06-12 DIAGNOSIS — F411 Generalized anxiety disorder: Secondary | ICD-10-CM

## 2023-06-12 DIAGNOSIS — Z1231 Encounter for screening mammogram for malignant neoplasm of breast: Secondary | ICD-10-CM | POA: Diagnosis not present

## 2023-06-12 NOTE — Progress Notes (Unsigned)
 Subjective:  Patient ID: Barbara Duncan, female    DOB: Dec 13, 1950  Age: 73 y.o. MRN: [id number]  Chief Complaint  Patient presents with   Medical Management of Chronic Issues   Discussed the use of AI scribe software for clinical note transcription with the patient, who gave verbal consent to proceed.  History of Present Illness          HPI   The patient, a 73 year old female, presents with follow up on Hypertension, hyperlipidemia.   The patient is also on pravastatin 20 mg daily for cholesterol management, but admits to not eating healthily despite regular walking. She is on citalopram, the latter of which was prescribed for previous panic attacks, which she no longer experiences. She also takes meloxicam for joint pain, which she reports as being manageable, although she has limited mobility in one shoulder.   Osteoporosis: She is finished with evenity monthly and will start doing prolia injections every 6 months.    Hypertension:  Patient tolerating Verapamil 180 mg every morning, Aspirin 81 mg daily well without side effects, and valsartan 40 mg daily added over the last 2 months. Has some low normal bps and feels a little dizzy.  At home readings have been 93-136/63-75   Hypothyroidism: She takes Levothyroxine 25 mcg daily.   GERD: on pantoprazole 40 mg daily.      04/16/2023    9:11 AM 03/27/2023    9:37 AM 11/14/2022    8:21 AM 08/17/2022    1:44 PM 06/22/2022    9:36 AM  Depression screen PHQ 2/9  Decreased Interest 0 0 0 0 0  Down, Depressed, Hopeless 0 0 0 0 0  PHQ - 2 Score 0 0 0 0 0  Altered sleeping 0 0 0 0   Tired, decreased energy 1 0 0 0   Change in appetite 0 0 0 0   Feeling bad or failure about yourself  0 0 0 0   Trouble concentrating 0 0 0 0   Moving slowly or fidgety/restless 0 0 0 0   Suicidal thoughts 0 0 0 0   PHQ-9 Score 1 0 0 0   Difficult doing work/chores Not difficult at all Not difficult at all Not difficult at all Not difficult at all          04/16/2023    9:11 AM  Fall Risk   Falls in the past year? 0  Number falls in past yr: 0  Injury with Fall? 0  Risk for fall due to : No Fall Risks  Follow up Falls evaluation completed    Patient Care Team: Blane Ohara, MD as PCP - General (Family Medicine) Illene Bolus as Referring Physician (Obstetrics and Gynecology)   Review of Systems  Constitutional:  Negative for chills, fatigue and fever.  HENT:  Negative for congestion, ear pain, rhinorrhea and sore throat.   Respiratory:  Negative for cough and shortness of breath.   Cardiovascular:  Negative for chest pain.  Gastrointestinal:  Negative for abdominal pain, constipation, diarrhea, nausea and vomiting.  Genitourinary:  Negative for dysuria and urgency.  Musculoskeletal:  Negative for back pain and myalgias.  Neurological:  Negative for dizziness, weakness, light-headedness and headaches.  Psychiatric/Behavioral:  Negative for dysphoric mood. The patient is not nervous/anxious.     Current Outpatient Medications on File Prior to Visit  Medication Sig Dispense Refill   aspirin EC 81 MG tablet Take 1 tablet (81 mg total) by mouth daily. 90 tablet 3  bisacodyl (DULCOLAX) 5 MG EC tablet Take 5 mg by mouth daily as needed for moderate constipation.     citalopram (CELEXA) 10 MG tablet Take 1 tablet (10 mg total) by mouth daily. 30 tablet 2   levothyroxine (SYNTHROID) 25 MCG tablet Take 1 tablet (25 mcg total) by mouth daily. 90 tablet 2   pantoprazole (PROTONIX) 40 MG tablet TAKE ONE TABLET BY MOUTH ONCE DAILY (Patient taking differently: Take 40 mg by mouth daily.) 90 tablet 2   pravastatin (PRAVACHOL) 20 MG tablet TAKE 1 TABLET BY MOUTH ONCE DAILY. 90 tablet 1   valsartan (DIOVAN) 40 MG tablet Take 1 tablet (40 mg total) by mouth daily. 90 tablet 3   verapamil (CALAN-SR) 180 MG CR tablet TAKE ONE TABLET BY MOUTH EVERY MORNING 90 tablet 1   Current Facility-Administered Medications on File Prior to Visit  Medication  Dose Route Frequency Provider Last Rate Last Admin   [START ON 06/27/2023] denosumab (PROLIA) injection 60 mg  60 mg Subcutaneous Once Blane Ohara, MD       Past Medical History:  Diagnosis Date   Acquired hypothyroidism    Adult BMI <19 kg/sq m 11/08/2021   Anemia    Aortic atherosclerosis (HCC)    Asthma    B12 deficiency 07/06/2015   Chronic idiopathic constipation 08/03/2022   Dyslipidemia    Encounter for immunization 03/10/2023   Essential hypertension 07/05/2015   GAD (generalized anxiety disorder) 03/10/2023   Hypothyroidism 06/25/2019   Mixed hyperlipidemia 06/25/2019   Osteoporosis    Rash 09/25/2022   Stroke (cerebrum) (HCC)    Stroke (HCC) 07/08/2015   Syncope    Vitamin B12 deficiency (non anemic) 06/17/2019   Past Surgical History:  Procedure Laterality Date   CARDIAC CATHETERIZATION     CHOLECYSTECTOMY      Family History  Problem Relation Age of Onset   Hypertension Mother    Hyperlipidemia Mother    Hypertension Father    Hyperlipidemia Father    Social History   Socioeconomic History   Marital status: Married    Spouse name: Not on file   Number of children: Not on file   Years of education: Not on file   Highest education level: Not on file  Occupational History   Not on file  Tobacco Use   Smoking status: Never   Smokeless tobacco: Never  Vaping Use   Vaping status: Never Used  Substance and Sexual Activity   Alcohol use: Never   Drug use: Never   Sexual activity: Not Currently  Other Topics Concern   Not on file  Social History Narrative   Not on file   Social Drivers of Health   Financial Resource Strain: Low Risk  (06/12/2023)   Overall Financial Resource Strain (CARDIA)    Difficulty of Paying Living Expenses: Not hard at all  Food Insecurity: No Food Insecurity (06/12/2023)   Hunger Vital Sign    Worried About Running Out of Food in the Last Year: Never true    Ran Out of Food in the Last Year: Never true  Transportation  Needs: No Transportation Needs (06/12/2023)   PRAPARE - Administrator, Civil Service (Medical): No    Lack of Transportation (Non-Medical): No  Physical Activity: Inactive (06/12/2023)   Exercise Vital Sign    Days of Exercise per Week: 2 days    Minutes of Exercise per Session: 0 min  Stress: No Stress Concern Present (06/12/2023)   [employer] of Occupational Health -  Occupational Stress Questionnaire    Feeling of Stress : Not at all  Social Connections: Socially Integrated (06/12/2023)   Social Connection and Isolation Panel [NHANES]    Frequency of Communication with Friends and Family: More than three times a week    Frequency of Social Gatherings with Friends and Family: Twice a week    Attends Religious Services: More than 4 times per year    Active Member of [employer] or Organizations: Yes    Attends Engineer, structural: More than 4 times per year    Marital Status: Married    Objective:  BP 130/70   Pulse 87   Temp 97.8 F (36.6 C)   Ht 5' 1.5" (1.562 m)   Wt 93 lb (42.2 kg)   SpO2 100%   BMI 17.29 kg/m      06/15/2023    1:22 PM 06/12/2023    9:08 AM 04/23/2023    2:40 PM  BP/Weight  Systolic BP 124 130 158  Diastolic BP 72 70 82  Wt. (Lbs) 94.6 93 91.2  BMI 18.48 kg/m2 17.29 kg/m2 17.81 kg/m2    Physical Exam Vitals reviewed.  Constitutional:      Appearance: Normal appearance. She is normal weight.  Neck:     Vascular: No carotid bruit.  Cardiovascular:     Rate and Rhythm: Normal rate and regular rhythm.     Heart sounds: Normal heart sounds.  Pulmonary:     Effort: Pulmonary effort is normal. No respiratory distress.     Breath sounds: Normal breath sounds.  Abdominal:     General: Abdomen is flat. Bowel sounds are normal.     Palpations: Abdomen is soft.     Tenderness: There is no abdominal tenderness.  Neurological:     Mental Status: She is alert and oriented to person, place, and time.  Psychiatric:        Mood and Affect:  Mood normal.        Behavior: Behavior normal.     Diabetic Foot Exam - Simple   No data filed      Lab Results  Component Value Date   WBC 6.3 06/12/2023   HGB 13.2 06/12/2023   HCT 40.3 06/12/2023   PLT 324 06/12/2023   GLUCOSE 84 06/12/2023   CHOL 177 06/12/2023   TRIG 76 06/12/2023   HDL 79 06/12/2023   LDLCALC 84 06/12/2023   ALT 12 06/12/2023   AST 19 06/12/2023   NA 142 06/12/2023   K 5.4 (H) 06/12/2023   CL 104 06/12/2023   CREATININE 0.97 06/12/2023   BUN 11 06/12/2023   CO2 19 (L) 06/12/2023   TSH 3.540 06/12/2023      Assessment & Plan:   1. Essential hypertension (Primary) - CBC with Differential/Platelet - Comprehensive metabolic panel  2. Chronic idiopathic constipation  3. Other specified hypothyroidism - TSH  4. Mixed hyperlipidemia - Lipid panel  5. GAD (generalized anxiety disorder)  6. Gastroesophageal reflux disease without esophagitis  7. Other osteoporosis without current pathological fracture  8. Aortic atherosclerosis (HCC)  9. Visit for screening mammogram - MM 3D SCREENING MAMMOGRAM BILATERAL BREAST; Future  10. Screen for colon cancer - Ambulatory referral to Gastroenterology     No orders of the defined types were placed in this encounter.   Orders Placed This Encounter  Procedures   MM 3D SCREENING MAMMOGRAM BILATERAL BREAST   CBC with Differential/Platelet   Comprehensive metabolic panel   Lipid panel   TSH  Ambulatory referral to Gastroenterology     Follow-up: Return in about 6 months (around 12/13/2023) for chronic follow up, awv in person with Selena Batten. Annia Friendly A Bramblett,acting as a scribe for Blane Ohara, MD.,have documented all relevant documentation on the behalf of Blane Ohara, MD,as directed by  Blane Ohara, MD while in the presence of Blane Ohara, MD.   Clayborn Bigness I Leal-Borjas,acting as a scribe for Blane Ohara, MD.,have documented all relevant documentation on the behalf of Blane Ohara, MD,as  directed by  Blane Ohara, MD while in the presence of Blane Ohara, MD.    An After Visit Summary was printed and given to the patient.  Blane Ohara, MD [facility] [phone]

## 2023-06-12 NOTE — Assessment & Plan Note (Signed)
Continue pravastatin and aspirin.

## 2023-06-12 NOTE — Assessment & Plan Note (Addendum)
 Well controlled on Pravastatin.  -Continue Pravastatin.  Recommend continue to work on eating healthy diet and exercise.

## 2023-06-12 NOTE — Assessment & Plan Note (Signed)
 Due to start prolia. Completed evenity.

## 2023-06-12 NOTE — Assessment & Plan Note (Signed)
 Previously well controlled Continue Synthroid at current dose  Recheck TSH and adjust Synthroid as indicated

## 2023-06-12 NOTE — Assessment & Plan Note (Signed)
 Bp at goal. Some lows.  Hold vasartan 40 mg daily. See if bps remain in normal range. If not patient to call us.  Continue verapamil 180 mg daily.

## 2023-06-13 LAB — CBC WITH DIFFERENTIAL/PLATELET
Basophils Absolute: 0.1 10*3/uL (ref 0.0–0.2)
Basos: 1 %
EOS (ABSOLUTE): 0.1 10*3/uL (ref 0.0–0.4)
Eos: 2 %
Hematocrit: 40.3 % (ref 34.0–46.6)
Hemoglobin: 13.2 g/dL (ref 11.1–15.9)
Immature Grans (Abs): 0 10*3/uL (ref 0.0–0.1)
Immature Granulocytes: 0 %
Lymphocytes Absolute: 1.8 10*3/uL (ref 0.7–3.1)
Lymphs: 29 %
MCH: 30.6 pg (ref 26.6–33.0)
MCHC: 32.8 g/dL (ref 31.5–35.7)
MCV: 94 fL (ref 79–97)
Monocytes Absolute: 0.6 10*3/uL (ref 0.1–0.9)
Monocytes: 10 %
Neutrophils Absolute: 3.7 10*3/uL (ref 1.4–7.0)
Neutrophils: 58 %
Platelets: 324 10*3/uL (ref 150–450)
RBC: 4.31 x10E6/uL (ref 3.77–5.28)
RDW: 13.3 % (ref 11.7–15.4)
WBC: 6.3 10*3/uL (ref 3.4–10.8)

## 2023-06-13 LAB — COMPREHENSIVE METABOLIC PANEL
ALT: 12 IU/L (ref 0–32)
AST: 19 IU/L (ref 0–40)
Albumin: 4.7 g/dL (ref 3.9–4.9)
Alkaline Phosphatase: 108 IU/L (ref 44–121)
BUN/Creatinine Ratio: 11 — ABNORMAL LOW (ref 12–28)
BUN: 11 mg/dL (ref 8–27)
Bilirubin Total: 0.3 mg/dL (ref 0.0–1.2)
CO2: 19 mmol/L — ABNORMAL LOW (ref 20–29)
Calcium: 10 mg/dL (ref 8.7–10.3)
Chloride: 104 mmol/L (ref 96–106)
Creatinine, Ser: 0.97 mg/dL (ref 0.57–1.00)
Globulin, Total: 2.2 g/dL (ref 1.5–4.5)
Glucose: 84 mg/dL (ref 70–99)
Potassium: 5.4 mmol/L — ABNORMAL HIGH (ref 3.5–5.2)
Sodium: 142 mmol/L (ref 134–144)
Total Protein: 6.9 g/dL (ref 6.0–8.5)
eGFR: 63 mL/min/{1.73_m2} (ref >59)

## 2023-06-13 LAB — LIPID PANEL
Chol/HDL Ratio: 2.2 ratio (ref 0.0–4.4)
Cholesterol, Total: 177 mg/dL (ref 100–199)
HDL: 79 mg/dL (ref >39)
LDL Chol Calc (NIH): 84 mg/dL (ref 0–99)
Triglycerides: 76 mg/dL (ref 0–149)
VLDL Cholesterol Cal: 14 mg/dL (ref 5–40)

## 2023-06-13 LAB — TSH: TSH: 3.54 u[IU]/mL (ref 0.450–4.500)

## 2023-06-14 NOTE — Progress Notes (Signed)
 Cardiology Office Note:    Date:  06/15/2023   ID:  Barbara Duncan, DOB 1950/04/18, MRN [id number]  PCP:  Blane Ohara, MD  Cardiologist:  Norman Herrlich, MD    Referring MD: Blane Ohara, MD    ASSESSMENT:    1. Essential hypertension   2. Aortic atherosclerosis (HCC)    PLAN:    In order of problems listed above:  Very well-controlled on single agent verapamil no side effects continue the same and if needed she can take a tablet of valsartan with elevated systolic blood pressure 150 or greater Continue her current statin well-tolerated LDL at target   Next appointment: I will see her back in the future as needed   Medication Adjustments/Labs and Tests Ordered: Current medicines are reviewed at length with the patient today.  Concerns regarding medicines are outlined above.  No orders of the defined types were placed in this encounter.  No orders of the defined types were placed in this encounter.    History of Present Illness:    Barbara Duncan is a 73 y.o. female with a hx of hypertension and aortic's atherosclerosis taking a statin last seen 04/23/2023.  Following that visit aldosterone renin ratio was normal vascular ultrasound of the kidneys showed a solitary right kidney and no findings of fibromuscular dysplasia or renal artery stenosis..  Recent lipid profile showed an LDL of 84 cholesterol 177 and non-HDL cholesterol 98. Compliance with diet, lifestyle and medications: Yes  Her blood pressure really is optimally controlled she runs in the range of 110s to 120/60-70 at home and currently only takes verapamil. She has a prescription for valsartan she can take as needed and we decided on a systolic blood pressure of 150 She is feeling well and is not having cardiovascular symptoms of edema shortness of breath orthopnea chest pain palpitation or syncope She tolerates her statin without muscle pain or weakness Past Medical History:  Diagnosis Date   Acquired hypothyroidism     Adult BMI <19 kg/sq m 11/08/2021   Anemia    Aortic atherosclerosis (HCC)    Asthma    B12 deficiency 07/06/2015   Chronic idiopathic constipation 08/03/2022   Dyslipidemia    Encounter for immunization 03/10/2023   Essential hypertension 07/05/2015   GAD (generalized anxiety disorder) 03/10/2023   Hypothyroidism 06/25/2019   Mixed hyperlipidemia 06/25/2019   Osteoporosis    Rash 09/25/2022   Stroke (cerebrum) (HCC)    Stroke (HCC) 07/08/2015   Syncope    Vitamin B12 deficiency (non anemic) 06/17/2019    Current Medications: Current Meds  Medication Sig   aspirin EC 81 MG tablet Take 1 tablet (81 mg total) by mouth daily.   bisacodyl (DULCOLAX) 5 MG EC tablet Take 5 mg by mouth daily as needed for moderate constipation.   citalopram (CELEXA) 10 MG tablet Take 1 tablet (10 mg total) by mouth daily.   levothyroxine (SYNTHROID) 25 MCG tablet Take 1 tablet (25 mcg total) by mouth daily.   pantoprazole (PROTONIX) 40 MG tablet TAKE ONE TABLET BY MOUTH ONCE DAILY (Patient taking differently: Take 40 mg by mouth daily.)   pravastatin (PRAVACHOL) 20 MG tablet TAKE 1 TABLET BY MOUTH ONCE DAILY.   valsartan (DIOVAN) 40 MG tablet Take 1 tablet (40 mg total) by mouth daily.   verapamil (CALAN-SR) 180 MG CR tablet TAKE ONE TABLET BY MOUTH EVERY MORNING   Current Facility-Administered Medications for the 06/15/23 encounter (Office Visit) with Baldo Daub, MD  Medication   [START ON  06/27/2023] denosumab (PROLIA) injection 60 mg      EKGs/Labs/Other Studies Reviewed:    The following studies were reviewed today:  Cardiac Studies & Procedures   ______________________________________________________________________________________________     ECHOCARDIOGRAM  ECHOCARDIOGRAM COMPLETE 05/05/2019  Narrative ECHOCARDIOGRAM REPORT    Patient Name:   Barbara Duncan Date of Exam: 05/05/2019 Medical Rec #:  [id number]    Height:       60.0 in Accession #:    [id number]   Weight:        98.2 lb Date of Birth:  06/23/1950    BSA:          1.38 m Patient Age:    68 years     BP:           166/77 mmHg Patient Gender: F            HR:           80 bpm. Exam Location:  [place]  Procedure: 2D Echo, Color Doppler and Cardiac Doppler  Indications:    R06.9 DOE  History:        Patient has no prior history of Echocardiogram examinations. Risk Factors:Dyslipidemia.  Sonographer:    Irving Burton Senior RDCS Referring Phys: [id number] Thurman Sarver J Rella Egelston  IMPRESSIONS   1. Left ventricular ejection fraction, by visual estimation, is 55 to 60%. The left ventricle has normal function. Left ventricular septal wall thickness was normal. Normal left ventricular posterior wall thickness. There is no left ventricular hypertrophy. 2. Left ventricular diastolic parameters are consistent with Grade I diastolic dysfunction (impaired relaxation). 3. The left ventricle has no regional wall motion abnormalities. 4. Global right ventricle has normal systolic function.The right ventricular size is normal. No increase in right ventricular wall thickness. 5. Left atrial size was normal. 6. Right atrial size was normal. 7. The mitral valve is normal in structure. Mild mitral valve regurgitation. No evidence of mitral stenosis. 8. The tricuspid valve is normal in structure. 9. The tricuspid valve is normal in structure. Tricuspid valve regurgitation is trivial. 10. The aortic valve is tricuspid. Aortic valve regurgitation is trivial. No evidence of aortic valve sclerosis or stenosis. 11. The pulmonic valve was normal in structure. Pulmonic valve regurgitation is not visualized. 12. Normal pulmonary artery systolic pressure. 13. The inferior vena cava is normal in size with greater than 50% respiratory variability, suggesting right atrial pressure of 3 mmHg.  FINDINGS Left Ventricle: Left ventricular ejection fraction, by visual estimation, is 55 to 60%. The left ventricle has normal function. The left ventricle  has no regional wall motion abnormalities. The left ventricular internal cavity size was the left ventricle is normal in size. Normal left ventricular posterior wall thickness. There is no left ventricular hypertrophy. Left ventricular diastolic parameters are consistent with Grade I diastolic dysfunction (impaired relaxation). Normal left atrial pressure.  Right Ventricle: The right ventricular size is normal. No increase in right ventricular wall thickness. Global RV systolic function is has normal systolic function. The tricuspid regurgitant velocity is 2.48 m/s, and with an assumed right atrial pressure of 3 mmHg, the estimated right ventricular systolic pressure is normal at 27.6 mmHg.  Left Atrium: Left atrial size was normal in size.  Right Atrium: Right atrial size was normal in size  Pericardium: There is no evidence of pericardial effusion.  Mitral Valve: The mitral valve is normal in structure. Mild mitral valve regurgitation. No evidence of mitral valve stenosis by observation.  Tricuspid Valve: The tricuspid valve is normal in structure.  Tricuspid valve regurgitation is trivial.  Aortic Valve: The aortic valve is tricuspid. Aortic valve regurgitation is trivial. The aortic valve is structurally normal, with no evidence of sclerosis or stenosis.  Pulmonic Valve: The pulmonic valve was normal in structure. Pulmonic valve regurgitation is not visualized. Pulmonic regurgitation is not visualized. No evidence of pulmonic stenosis.  Aorta: The aortic root, ascending aorta and aortic arch are all structurally normal, with no evidence of dilitation or obstruction.  Venous: The pulmonary veins were not well visualized. The inferior vena cava is normal in size with greater than 50% respiratory variability, suggesting right atrial pressure of 3 mmHg.  IAS/Shunts: No atrial level shunt detected by color flow Doppler. There is no evidence of a patent foramen ovale. No ventricular septal  defect is seen or detected. There is no evidence of an atrial septal defect.   LEFT VENTRICLE PLAX 2D LVIDd:         3.30 cm  Diastology LVIDs:         2.00 cm  LV e' lateral:   9.57 cm/s LV PW:         0.70 cm  LV E/e' lateral: 4.8 LV IVS:        0.60 cm  LV e' medial:    3.92 cm/s LVOT diam:     1.80 cm  LV E/e' medial:  11.8 LV SV:         31 ml LV SV Index:   22.91 LVOT Area:     2.54 cm   RIGHT VENTRICLE RV S prime:     15.30 cm/s TAPSE (M-mode): 2.0 cm  LEFT ATRIUM             Index       RIGHT ATRIUM          Index LA diam:        2.30 cm 1.67 cm/m  RA Area:     6.07 cm LA Vol (A2C):   22.1 ml 16.02 ml/m RA Volume:   8.51 ml  6.17 ml/m LA Vol (A4C):   14.4 ml 10.44 ml/m LA Biplane Vol: 17.9 ml 12.97 ml/m AORTIC VALVE LVOT Vmax:   85.70 cm/s LVOT Vmean:  59.400 cm/s LVOT VTI:    0.179 m  AORTA Ao Root diam: 2.20 cm Ao Asc diam:  2.50 cm  MITRAL VALVE                        TRICUSPID VALVE MV Area (PHT): 2.80 cm             TR Peak grad:   24.6 mmHg MV PHT:        78.59 msec           TR Vmax:        248.00 cm/s MV Decel Time: 271 msec MV E velocity: 46.10 cm/s 103 cm/s  SHUNTS MV A velocity: 82.50 cm/s 70.3 cm/s Systemic VTI:  0.18 m MV E/A ratio:  0.56       1.5       Systemic Diam: 1.80 cm   Norman Herrlich MD Electronically signed by Norman Herrlich MD Signature Date/Time: 05/05/2019/3:46:22 PM    Final          ______________________________________________________________________________________________          Recent Labs: 06/12/2023: ALT 12; BUN 11; Creatinine, Ser 0.97; Hemoglobin 13.2; Platelets 324; Potassium 5.4; Sodium 142; TSH 3.540  Recent Lipid Panel    Component Value  Date/Time   CHOL 177 06/12/2023 0950   TRIG 76 06/12/2023 0950   HDL 79 06/12/2023 0950   CHOLHDL 2.2 06/12/2023 0950   LDLCALC 84 06/12/2023 0950    Physical Exam:    VS:  BP 124/72 (BP Location: Right Arm, Patient Position: Sitting)   Pulse 71   Ht 5'  (1.524 m)   Wt 94 lb 9.6 oz (42.9 kg)   SpO2 99%   BMI 18.48 kg/m     Wt Readings from Last 3 Encounters:  06/15/23 94 lb 9.6 oz (42.9 kg)  06/12/23 93 lb (42.2 kg)  04/23/23 91 lb 3.2 oz (41.4 kg)     GEN:  Well nourished, well developed in no acute distress HEENT: Normal NECK: No JVD; No carotid bruits LYMPHATICS: No lymphadenopathy CARDIAC: RRR, no murmurs, rubs, gallops RESPIRATORY:  Clear to auscultation without rales, wheezing or rhonchi  ABDOMEN: Soft, non-tender, non-distended MUSCULOSKELETAL:  No edema; No deformity  SKIN: Warm and dry NEUROLOGIC:  Alert and oriented x 3 PSYCHIATRIC:  Normal affect    Signed, Norman Herrlich, MD  06/15/2023 1:39 PM    [facility]

## 2023-06-15 ENCOUNTER — Ambulatory Visit: Payer: Medicare Other | Attending: Cardiology | Admitting: Cardiology

## 2023-06-15 ENCOUNTER — Encounter: Payer: Self-pay | Admitting: Cardiology

## 2023-06-15 VITALS — BP 124/72 | HR 71 | Ht 60.0 in | Wt 94.6 lb

## 2023-06-15 DIAGNOSIS — I10 Essential (primary) hypertension: Secondary | ICD-10-CM | POA: Insufficient documentation

## 2023-06-15 DIAGNOSIS — I700 Atherosclerosis of aorta: Secondary | ICD-10-CM | POA: Insufficient documentation

## 2023-06-15 NOTE — Patient Instructions (Signed)
Medication Instructions:  Your physician recommends that you continue on your current medications as directed. Please refer to the Current Medication list given to you today.  *If you need a refill on your cardiac medications before your next appointment, please call your pharmacy*   Lab Work: None If you have labs (blood work) drawn today and your tests are completely normal, you will receive your results only by: MyChart Message (if you have MyChart) OR A paper copy in the mail If you have any lab test that is abnormal or we need to change your treatment, we will call you to review the results.   Testing/Procedures: None   Follow-Up: At [facility], you and your health needs are our priority.  As part of our continuing mission to provide you with exceptional heart care, we have created designated Provider Care Teams.  These Care Teams include your primary Cardiologist (physician) and Advanced Practice Providers (APPs -  Physician Assistants and Nurse Practitioners) who all work together to provide you with the care you need, when you need it.  We recommend signing up for the patient portal called "MyChart".  Sign up information is provided on this After Visit Summary.  MyChart is used to connect with patients for Virtual Visits (Telemedicine).  Patients are able to view lab/test results, encounter notes, upcoming appointments, etc.  Non-urgent messages can be sent to your provider as well.   To learn more about what you can do with MyChart, go to [email or web address].    Your next appointment:   Follow up as needed  Provider:   Brian Munley, MD    Other Instructions None  

## 2023-06-16 DIAGNOSIS — K219 Gastro-esophageal reflux disease without esophagitis: Secondary | ICD-10-CM | POA: Insufficient documentation

## 2023-06-16 NOTE — Assessment & Plan Note (Signed)
 Currently taking Dulcolax 5 mg daily. Continue to work on eating a healthy diet and exercise.    No major side effects reported, and no issues with compliance.

## 2023-06-16 NOTE — Assessment & Plan Note (Signed)
 The current medical regimen is effective;  continue present plan and medications.  On pantoprazole with no breakthrough symptoms.

## 2023-06-16 NOTE — Assessment & Plan Note (Signed)
 The current medical regimen is effective;  continue present plan and medications.  On citalopram for management.

## 2023-06-27 ENCOUNTER — Encounter: Payer: Self-pay | Admitting: Family Medicine

## 2023-06-27 ENCOUNTER — Ambulatory Visit: Admitting: Family Medicine

## 2023-06-27 ENCOUNTER — Ambulatory Visit (INDEPENDENT_AMBULATORY_CARE_PROVIDER_SITE_OTHER): Payer: Medicare Other

## 2023-06-27 VITALS — BP 102/64 | HR 72 | Temp 97.6°F | Resp 16 | Ht 60.0 in | Wt 94.2 lb

## 2023-06-27 DIAGNOSIS — Z1211 Encounter for screening for malignant neoplasm of colon: Secondary | ICD-10-CM | POA: Diagnosis not present

## 2023-06-27 DIAGNOSIS — M818 Other osteoporosis without current pathological fracture: Secondary | ICD-10-CM | POA: Diagnosis not present

## 2023-06-27 DIAGNOSIS — Z0000 Encounter for general adult medical examination without abnormal findings: Secondary | ICD-10-CM | POA: Insufficient documentation

## 2023-06-27 MED ORDER — DENOSUMAB 60 MG/ML ~~LOC~~ SOSY
60.0000 mg | PREFILLED_SYRINGE | SUBCUTANEOUS | Status: AC
  Administered 2023-12-31: 60 mg via SUBCUTANEOUS

## 2023-06-27 NOTE — Progress Notes (Signed)
   Patient: Barbara Duncan  DOB: 04-22-1950  MRN: [id number]    Visit Date: 06/27/2023    Charlyn Vialpando presents today for her initial Prolia injection.  1 x 60 mg Single-Dose Prefilled Syringe was given SQ in the left arm.  Patient tolerated the injection well and has no questions.  The next Prolia injection will be due in six months.      Danny Lawless, CMA

## 2023-06-27 NOTE — Progress Notes (Signed)
 Subjective:   Barbara Duncan is a 73 y.o. female who presents for Medicare Annual (Subsequent) preventive examination.  Visit Complete: In person  Patient Medicare AWV questionnaire was completed by the patient on 06/27/2023; I have confirmed that all information answered by patient is correct and no changes since this date.  Cardiac Risk Factors include: sedentary lifestyle     Objective:    Today's Vitals   06/27/23 1431  BP: 102/64  Pulse: 72  Resp: 16  Temp: 97.6 F (36.4 C)  TempSrc: Temporal  SpO2: 99%  Weight: 94 lb 3.2 oz (42.7 kg)  Height: 5' (1.524 m)  PainSc: 0-No pain   Body mass index is 18.4 kg/m.     06/22/2022    9:25 AM  Advanced Directives  Does Patient Have a Medical Advance Directive? No  Would patient like information on creating a medical advance directive? Yes (ED - Information included in AVS)    Current Medications (verified) Outpatient Encounter Medications as of 06/27/2023  Medication Sig   aspirin EC 81 MG tablet Take 1 tablet (81 mg total) by mouth daily.   bisacodyl (DULCOLAX) 5 MG EC tablet Take 5 mg by mouth daily as needed for moderate constipation.   citalopram (CELEXA) 10 MG tablet Take 1 tablet (10 mg total) by mouth daily.   levothyroxine (SYNTHROID) 25 MCG tablet Take 1 tablet (25 mcg total) by mouth daily.   pantoprazole (PROTONIX) 40 MG tablet TAKE ONE TABLET BY MOUTH ONCE DAILY (Patient taking differently: Take 40 mg by mouth daily.)   pravastatin (PRAVACHOL) 20 MG tablet TAKE 1 TABLET BY MOUTH ONCE DAILY.   verapamil (CALAN-SR) 180 MG CR tablet TAKE ONE TABLET BY MOUTH EVERY MORNING   [DISCONTINUED] valsartan (DIOVAN) 40 MG tablet Take 1 tablet (40 mg total) by mouth daily.   Facility-Administered Encounter Medications as of 06/27/2023  Medication   denosumab (PROLIA) injection 60 mg    Allergies (verified) Patient has no known allergies.   History: Past Medical History:  Diagnosis Date   Acquired hypothyroidism     Adult BMI <19 kg/sq m 11/08/2021   Anemia    Aortic atherosclerosis (HCC)    Asthma    B12 deficiency 07/06/2015   Chronic idiopathic constipation 08/03/2022   Dyslipidemia    Encounter for immunization 03/10/2023   Essential hypertension 07/05/2015   GAD (generalized anxiety disorder) 03/10/2023   Hypothyroidism 06/25/2019   Mixed hyperlipidemia 06/25/2019   Osteoporosis    Rash 09/25/2022   Stroke (cerebrum) (HCC)    Stroke (HCC) 07/08/2015   Syncope    Vitamin B12 deficiency (non anemic) 06/17/2019   Past Surgical History:  Procedure Laterality Date   CARDIAC CATHETERIZATION     CHOLECYSTECTOMY     Family History  Problem Relation Age of Onset   Hypertension Mother    Hyperlipidemia Mother    Hypertension Father    Hyperlipidemia Father    Social History   Socioeconomic History   Marital status: Married    Spouse name: Not on file   Number of children: Not on file   Years of education: Not on file   Highest education level: Not on file  Occupational History   Not on file  Tobacco Use   Smoking status: Never   Smokeless tobacco: Never  Vaping Use   Vaping status: Never Used  Substance and Sexual Activity   Alcohol use: Never   Drug use: Never   Sexual activity: Not Currently  Other Topics Concern  Not on file  Social History Narrative   Not on file   Social Drivers of Health   Financial Resource Strain: Low Risk  (06/12/2023)   Overall Financial Resource Strain (CARDIA)    Difficulty of Paying Living Expenses: Not hard at all  Food Insecurity: No Food Insecurity (06/12/2023)   Hunger Vital Sign    Worried About Running Out of Food in the Last Year: Never true    Ran Out of Food in the Last Year: Never true  Transportation Needs: No Transportation Needs (06/12/2023)   PRAPARE - Administrator, Civil Service (Medical): No    Lack of Transportation (Non-Medical): No  Physical Activity: Inactive (06/12/2023)   Exercise Vital Sign    Days of  Exercise per Week: 2 days    Minutes of Exercise per Session: 0 min  Stress: No Stress Concern Present (06/12/2023)   [employer] of Occupational Health - Occupational Stress Questionnaire    Feeling of Stress : Not at all  Social Connections: Socially Integrated (06/12/2023)   Social Connection and Isolation Panel [NHANES]    Frequency of Communication with Friends and Family: More than three times a week    Frequency of Social Gatherings with Friends and Family: Twice a week    Attends Religious Services: More than 4 times per year    Active Member of [employer] or Organizations: Yes    Attends Engineer, structural: More than 4 times per year    Marital Status: Married    Tobacco Counseling Counseling given: Not Answered   Clinical Intake:  Pre-visit preparation completed: Yes  Pain : No/denies pain Pain Score: 0-No pain     Nutritional Status: BMI <19  Underweight Nutritional Risks: None Diabetes: No  How often do you need to have someone help you when you read instructions, pamphlets, or other written materials from your doctor or pharmacy?: 1 - Never  Interpreter Needed?: No      Activities of Daily Living    06/27/2023    2:34 PM  In your present state of health, do you have any difficulty performing the following activities:  Hearing? 0  Vision? 0  Difficulty concentrating or making decisions? 0  Walking or climbing stairs? 0  Dressing or bathing? 0  Doing errands, shopping? 0  Preparing Food and eating ? N  Using the Toilet? N  In the past six months, have you accidently leaked urine? N  Do you have problems with loss of bowel control? N  Managing your Medications? N  Managing your Finances? N  Housekeeping or managing your Housekeeping? N    Patient Care Team: CoxFritzi Mandes, MD as PCP - General (Family Medicine) Illene Bolus as Referring Physician (Obstetrics and Gynecology)  Indicate any recent Medical Services you may have received from  other than [facility] providers in the past year (date may be approximate).     Assessment:   This is a routine wellness examination for [facility].  Hearing/Vision screen No results found.   Goals Addressed             This Visit's Progress    DIET - EAT MORE FRUITS AND VEGETABLES       Aim to do some physical activity for 150 minutes per week. This is typically divided into 5 days per week, 30 minutes per day. The activity should be enough to get your heart rate up. Anything is better than nothing if you have time constraints.  Depression Screen    06/27/2023    2:41 PM 04/16/2023    9:11 AM 03/27/2023    9:37 AM 11/14/2022    8:21 AM 08/17/2022    1:44 PM 06/22/2022    9:36 AM 05/15/2022    8:57 AM  PHQ 2/9 Scores  PHQ - 2 Score 0 0 0 0 0 0 0  PHQ- 9 Score  1 0 0 0      Fall Risk    06/27/2023    2:39 PM 04/16/2023    9:11 AM 03/27/2023    9:37 AM 11/14/2022    8:21 AM 08/17/2022    1:43 PM  Fall Risk   Falls in the past year? 0 0 0 0 0  Number falls in past yr: 0 0 0 0 0  Injury with Fall? 0 0 0 0 0  Risk for fall due to : No Fall Risks No Fall Risks No Fall Risks No Fall Risks No Fall Risks  Follow up Falls evaluation completed Falls evaluation completed Falls evaluation completed Falls evaluation completed;Follow up appointment Falls prevention discussed    MEDICARE RISK AT HOME: Medicare Risk at Home Any stairs in or around the home?: Yes If so, are there any without handrails?: Yes Home free of loose throw rugs in walkways, pet beds, electrical cords, etc?: Yes Adequate lighting in your home to reduce risk of falls?: Yes Life alert?: No Use of a cane, walker or w/c?: No Grab bars in the bathroom?: No Shower chair or bench in shower?: No Elevated toilet seat or a handicapped toilet?: Yes  TIMED UP AND GO:  Was the test performed?  Yes  Length of time to ambulate 10 feet: 10 sec Gait steady and fast without use of assistive device    Cognitive Function:         06/27/2023    2:41 PM 06/22/2022    9:32 AM  6CIT Screen  What Year? 0 points 0 points  What month? 0 points 0 points  What time? 0 points 0 points  Count back from 20 0 points 0 points  Months in reverse 0 points 0 points  Repeat phrase 0 points 0 points  Total Score 0 points 0 points    Immunizations Immunization History  Administered Date(s) Administered   Fluad Quad(high Dose 65+) 03/23/2020, 05/09/2021, 05/15/2022   Fluad Trivalent(High Dose 65+) 03/06/2023   Fluzone Influenza virus vaccine,trivalent (IIV3), split virus 05/18/2014   Moderna Covid-19 Vaccine Bivalent Booster 6yrs & up 05/09/2021   Moderna SARS-COV2 Booster Vaccination 03/23/2020, 09/13/2020   Moderna Sars-Covid-2 Vaccination 05/15/2019, 06/10/2019   PNEUMOCOCCAL CONJUGATE-20 05/15/2022   Pneumococcal Polysaccharide-23 01/15/2018   Tdap 10/25/2006, 01/22/2018   Zoster Recombinant(Shingrix) 11/09/2021, 01/09/2022    TDAP status: Up to date  Flu Vaccine status: Up to date  Pneumococcal vaccine status: Up to date  Covid-19 vaccine status: Declined, Education has been provided regarding the importance of this vaccine but patient still declined. Advised may receive this vaccine at local pharmacy or Health Dept.or vaccine clinic. Aware to provide a copy of the vaccination record if obtained from local pharmacy or Health Dept. Verbalized acceptance and understanding.  Qualifies for Shingles Vaccine? Yes   Zostavax completed? No Shingrix Completed?: Yes Completed on 01/09/2022  Screening Tests Health Maintenance  Topic Date Due   Colonoscopy  12/26/2022   COVID-19 Vaccine (6 - 2024-25 season) 06/28/2023 (Originally 12/10/2022)   MAMMOGRAM  11/08/2023   Medicare Annual Wellness (AWV)  06/26/2024   DTaP/Tdap/Td (  3 - Td or Tdap) 01/23/2028   Pneumonia Vaccine 16+ Years old  Completed   INFLUENZA VACCINE  Completed   DEXA SCAN  Completed   Hepatitis C Screening  Completed   Zoster Vaccines- Shingrix  Completed    HPV VACCINES  Aged Out    Health Maintenance  Health Maintenance Due  Topic Date Due   Colonoscopy  12/26/2022    Colorectal cancer screening: Type of screening: Colonoscopy. Completed 12/25/2012. Repeat every 10 years. Patient would like a referral to Dr Chales Abrahams, [facility].  Mammogram status: Completed 10/20/2021. Repeat every year repeat every two years.  Bone Density status: Completed 05/19/2021. Results reflect: Bone density results: OSTEOPOROSIS. Repeat every 02 years.  Lung Cancer Screening: (Low Dose CT Chest recommended if Age 52-80 years, 20 pack-year currently smoking OR have quit w/in 15years.) does not qualify.   Lung Cancer Screening Referral: N/A  Additional Screening:  Hepatitis C Screening: does not qualify; Completed 11/08/2021  Vision Screening: Recommended annual ophthalmology exams for early detection of glaucoma and other disorders of the eye. Is the patient up to date with their annual eye exam?  Yes  Who is the provider or what is the name of the office in which the patient attends annual eye exams? [facility], Dr Tiburcio Pea If pt is not established with a provider, would they like to be referred to a provider to establish care? No .   Dental Screening: Recommended annual dental exams for proper oral hygiene. Patient saw Dr Faylene Million at [facility] yesterday.  Diabetic Foot Exam: N/A  Community Resource Referral / Chronic Care Management: CRR required this visit?  No   CCM required this visit?  No     Plan:    Encounter for Medicare annual wellness exam Assessment & Plan: Things to do to keep yourself healthy  - Exercise at least 30-45 minutes a day, 3-4 days a week.  - Eat a low-fat diet with lots of fruits and vegetables, up to 7-9 servings per day.  - Seatbelts can save your life. Wear them always.  - Smoke detectors on every level of your home, check batteries every year.  - Eye Doctor - have an eye exam  every 1-2 years  - Health Care Power of [address]. Choose someone to speak for you if you are not able.  - Depression is common in our stressful world.If you're feeling down or losing interest in things you normally enjoy, please come in for a visit.  - Violence - If anyone is threatening or hurting you, please call immediately.    Screen for colon cancer -     Ambulatory referral to Gastroenterology  Other osteoporosis without current pathological fracture Assessment & Plan: Start prolia today Completed evenity.      I have personally reviewed and noted the following in the patient's chart:   Medical and social history Use of alcohol, tobacco or illicit drugs  Current medications and supplements including opioid prescriptions. Patient is not currently taking opioid prescriptions. Functional ability and status Nutritional status Physical activity Advanced directives List of other physicians Hospitalizations, surgeries, and ER visits in previous 12 months Vitals Screenings to include cognitive, depression, and falls Referrals and appointments  In addition, I have reviewed and discussed with patient certain preventive protocols, quality metrics, and best practice recommendations. A written personalized care plan for preventive services as well as general preventive health recommendations were provided to patient.    Lajuana Matte, FNP [facility]  [phone]     06/27/2023   After Visit Summary: (In Person-Printed) AVS printed and given to the patient

## 2023-06-27 NOTE — Assessment & Plan Note (Signed)
 Start prolia today Completed evenity.

## 2023-06-27 NOTE — Assessment & Plan Note (Signed)
Things to do to keep yourself healthy  - Exercise at least 30-45 minutes a day, 3-4 days a week.  - Eat a low-fat diet with lots of fruits and vegetables, up to 7-9 servings per day.  - Seatbelts can save your life. Wear them always.  - Smoke detectors on every level of your home, check batteries every year.  - Eye Doctor - have an eye exam every 1-2 years  - Health Care Power of Attorney. Choose someone to speak for you if you are not able.  - Depression is common in our stressful world.If you're feeling down or losing interest in things you normally enjoy, please come in for a visit.  - Violence - If anyone is threatening or hurting you, please call immediately. 

## 2023-07-02 ENCOUNTER — Other Ambulatory Visit: Payer: Self-pay | Admitting: Family Medicine

## 2023-07-05 ENCOUNTER — Encounter: Payer: Self-pay | Admitting: Gastroenterology

## 2023-08-30 ENCOUNTER — Other Ambulatory Visit: Payer: Self-pay | Admitting: Family Medicine

## 2023-08-30 DIAGNOSIS — E039 Hypothyroidism, unspecified: Secondary | ICD-10-CM

## 2023-09-24 ENCOUNTER — Ambulatory Visit: Admitting: Gastroenterology

## 2023-09-24 ENCOUNTER — Encounter: Payer: Self-pay | Admitting: Gastroenterology

## 2023-09-24 VITALS — BP 164/80 | HR 68 | Ht 59.5 in | Wt 93.5 lb

## 2023-09-24 DIAGNOSIS — Z1211 Encounter for screening for malignant neoplasm of colon: Secondary | ICD-10-CM

## 2023-09-24 DIAGNOSIS — K581 Irritable bowel syndrome with constipation: Secondary | ICD-10-CM

## 2023-09-24 DIAGNOSIS — K219 Gastro-esophageal reflux disease without esophagitis: Secondary | ICD-10-CM | POA: Diagnosis not present

## 2023-09-24 DIAGNOSIS — Z83719 Family history of colon polyps, unspecified: Secondary | ICD-10-CM | POA: Diagnosis not present

## 2023-09-24 DIAGNOSIS — K449 Diaphragmatic hernia without obstruction or gangrene: Secondary | ICD-10-CM

## 2023-09-24 NOTE — Patient Instructions (Addendum)
 _______________________________________________________  If your blood pressure at your visit was 140/90 or greater, please contact your primary care physician to follow up on this.  _______________________________________________________  If you are age 71 or older, your body mass index should be between 23-30. Your Body mass index is 18.57 kg/m. If this is out of the aforementioned range listed, please consider follow up with your Primary Care Provider.  If you are age 49 or younger, your body mass index should be between 19-25. Your Body mass index is 18.57 kg/m. If this is out of the aformentioned range listed, please consider follow up with your Primary Care Provider.   ________________________________________________________  The [facility] providers would like to encourage you to use MYCHART to communicate with providers for non-urgent requests or questions.  Due to long hold times on the telephone, sending your provider a message by [facility] may be a faster and more efficient way to get a response.  Please allow 48 business hours for a response.  Please remember that this is for non-urgent requests.  _______________________________________________________  If no bowel movement in 2 days, take 2 tablets of dulcolax.   Avoid NSAID's  Please purchase the following medications over the counter and take as directed: Miralax 17g daily  We have given you samples of the following medication to take: Clenpiq Exp date 05-10-24 Lot No [id number]  Two days before your procedure: Mix 3 packs (or capfuls) of Miralax in 48 ounces of clear liquid and drink at 6pm.  You have been scheduled for a colonoscopy. Please follow written instructions given to you at your visit today.   If you use inhalers (even only as needed), please bring them with you on the day of your procedure.  DO NOT TAKE 7 DAYS PRIOR TO TEST- Trulicity (dulaglutide) Ozempic, Wegovy (semaglutide) Mounjaro  (tirzepatide) Bydureon Bcise (exanatide extended release)  DO NOT TAKE 1 DAY PRIOR TO YOUR TEST Rybelsus (semaglutide) Adlyxin (lixisenatide) Victoza (liraglutide) Byetta (exanatide) ___________________________________________________________________________

## 2023-09-24 NOTE — Progress Notes (Signed)
 Chief Complaint: For colonoscopy  Referring Provider:  Janece Means, FNP      ASSESSMENT AND PLAN;   #1. FH polyps (at age >29)  #2. IBS with predom constipation  #3.  Asymptomatic GERD with HH.  Diet controlled.  Plan: -Colon with 2 day prep -Miralax 17g po every day -Avoids NSAIds -If no BM x 2 days, take 2 tabs of dulcolax.   Discussed risks & benefits of colonoscopy. Risks including rare perforation req laparotomy, bleeding after bx/polypectomy req blood transfusion, rarely missing neoplasms, risks of anesthesia/sedation, rare risk of damage to internal organs. Benefits outweigh the risks. Patient agrees to proceed. All the questions were answered. Pt consents to proceed.  HPI:    Barbara Duncan is a 73 y.o. female  Accompanied by her husband  History of Present Illness Barbara Duncan is a 73 year old female with constipation and diverticulosis who presents with alternating constipation and diarrhea.  She experiences alternating episodes of constipation and diarrhea, with constipation lasting a week or more followed by sudden onset diarrhea lasting a day or two. This pattern disrupts her daily schedule.  She has attempted to manage her symptoms with Metamucil and Dulcolax. Metamucil is ineffective, and Dulcolax provides only partial relief, as she does not feel completely emptied. She takes Dulcolax at night, but it does not consistently result in a bowel movement the following day.  About a year ago, she experienced significant abdominal pain, leading to emergency care at [facility]. She was found to have a large amount of stool in her colon and was treated with Miralax. A CT scan from May 2024 showed a moderate-sized hiatal hernia and extensive stool with mildly dilated loops of the colon.  Her past medical history includes a cholecystectomy. Her mother had polyps diagnosed in her sixties or seventies.  Socially, she prefers drinking sweet tea over water and  notes abdominal bloating, making her appear 'almost pregnant'.  No heartburn, nausea, or vomiting. She occasionally takes Tylenol for infrequent headaches.  Results RADIOLOGY CT scan: Moderate-sized hiatal hernia and extensive stool with mildly dilated loops of the colon (08/10/2022)  DIAGNOSTIC Colonoscopy([facility]): Small internal hemorrhoids, otherwise normal (12/25/2012)    Past Medical History:  Diagnosis Date   Acquired hypothyroidism    Adult BMI <19 kg/sq m 11/08/2021   Anemia    Aortic atherosclerosis (HCC)    Asthma    B12 deficiency 07/06/2015   Chronic idiopathic constipation 08/03/2022   Dyslipidemia    Encounter for immunization 03/10/2023   Essential hypertension 07/05/2015   GAD (generalized anxiety disorder) 03/10/2023   Gallstones 2009   Hypothyroidism 06/25/2019   Mixed hyperlipidemia 06/25/2019   Osteoporosis    Rash 09/25/2022   Stroke (HCC) 07/08/2015   Syncope    Vitamin B12 deficiency (non anemic) 06/17/2019    Past Surgical History:  Procedure Laterality Date   CARDIAC CATHETERIZATION     CHOLECYSTECTOMY     COLONOSCOPY  12/25/2012   Small internal hemorrhoids. Otherwise normal colonoscopy. The colon was highly redundant   ELBOW SURGERY Right 2008   Dr Abbie Hock, tendon release   Lap Uterine Suspension  2002    Family History  Problem Relation Age of Onset   Hypertension Mother    Hyperlipidemia Mother    Colon polyps Mother    Diabetes Mother    Hypertension Father    Hyperlipidemia Father    Parkinson's disease Father    Dementia Sister    Alzheimer's disease Sister  Diabetes Sister    Heart disease Maternal Grandmother     Social History   Tobacco Use   Smoking status: Never   Smokeless tobacco: Never  Vaping Use   Vaping status: Never Used  Substance Use Topics   Alcohol use: Never   Drug use: Never    Current Outpatient Medications  Medication Sig Dispense Refill   aspirin  EC 81 MG tablet Take 1 tablet (81 mg total)  by mouth daily. 90 tablet 3   bisacodyl (DULCOLAX) 5 MG EC tablet Take 5 mg by mouth daily as needed for moderate constipation.     citalopram  (CELEXA ) 10 MG tablet Take 1 tablet (10 mg total) by mouth daily. 30 tablet 2   levothyroxine  (SYNTHROID ) 25 MCG tablet TAKE 1 TABLET BY MOUTH ONCE DAILY. 90 tablet 2   pantoprazole (PROTONIX) 40 MG tablet TAKE ONE TABLET BY MOUTH ONCE DAILY (Patient taking differently: Take 40 mg by mouth daily.) 90 tablet 2   pravastatin  (PRAVACHOL ) 20 MG tablet TAKE 1 TABLET BY MOUTH ONCE DAILY. 90 tablet 1   verapamil  (CALAN -SR) 180 MG CR tablet TAKE 1 TABLET BY MOUTH EVERY MORNING. 90 tablet 1   Current Facility-Administered Medications  Medication Dose Route Frequency Provider Last Rate Last Admin   [START ON 12/28/2023] denosumab  (PROLIA ) injection 60 mg  60 mg Subcutaneous Q6 months Cox, Kirsten, MD        No Known Allergies  Review of Systems:  Constitutional: Denies fever, chills, diaphoresis, appetite change and fatigue.  HEENT: Denies photophobia, eye pain, redness, hearing loss, ear pain, congestion, sore throat, rhinorrhea, sneezing, mouth sores, neck pain, neck stiffness and tinnitus.   Respiratory: Denies SOB, DOE, cough, chest tightness,  and wheezing.   Cardiovascular: Denies chest pain, palpitations and leg swelling.  Genitourinary: Denies dysuria, urgency, frequency, hematuria, flank pain and difficulty urinating.  Musculoskeletal: Denies myalgias, back pain, joint swelling, arthralgias and gait problem.  Skin: No rash.  Neurological: Denies dizziness, seizures, syncope, weakness, light-headedness, numbness and headaches.  Hematological: Denies adenopathy. Easy bruising, personal or family bleeding history  Psychiatric/Behavioral: No anxiety or depression     Physical Exam:    BP (!) 164/80 (BP Location: Right Arm, Patient Position: Sitting, Cuff Size: Normal)   Pulse 68   Ht 4' 11.5 (1.511 m) Comment: height measured without shoes  Wt 93  lb 8 oz (42.4 kg)   BMI 18.57 kg/m  Wt Readings from Last 3 Encounters:  09/24/23 93 lb 8 oz (42.4 kg)  06/27/23 94 lb 3.2 oz (42.7 kg)  06/15/23 94 lb 9.6 oz (42.9 kg)   Constitutional:  Well-developed, in no acute distress. Psychiatric: Normal mood and affect. Behavior is normal. HEENT: Pupils normal.  Conjunctivae are normal. No scleral icterus. Neck supple.  Cardiovascular: Normal rate, regular rhythm. No edema Pulmonary/chest: Effort normal and breath sounds normal. No wheezing, rales or rhonchi. Abdominal: Soft, nondistended. Nontender. Bowel sounds active throughout. There are no masses palpable. No hepatomegaly. Rectal: Deferred Neurological: Alert and oriented to person place and time. Skin: Skin is warm and dry. No rashes noted.  Data Reviewed: I have personally reviewed following labs and imaging studies  CBC:    Latest Ref Rng & Units 06/12/2023    9:50 AM 11/14/2022    8:39 AM 05/15/2022   10:12 AM  CBC  WBC 3.4 - 10.8 x10E3/uL 6.3  8.3  6.4   Hemoglobin 11.1 - 15.9 g/dL 16.1  09.6  04.5   Hematocrit 34.0 - 46.6 % 40.3  39.1  40.7   Platelets 150 - 450 x10E3/uL 324  302  393     CMP:    Latest Ref Rng & Units 06/12/2023    9:50 AM 11/14/2022    8:39 AM 05/15/2022   10:12 AM  CMP  Glucose 70 - 99 mg/dL 84  79  89   BUN 8 - 27 mg/dL 11  13  11    Creatinine 0.57 - 1.00 mg/dL 4.09  8.11  9.14   Sodium 134 - 144 mmol/L 142  139  139   Potassium 3.5 - 5.2 mmol/L 5.4  5.3  5.3   Chloride 96 - 106 mmol/L 104  102  101   CO2 20 - 29 mmol/L 19  23  22    Calcium 8.7 - 10.3 mg/dL 78.2  9.8  95.6   Total Protein 6.0 - 8.5 g/dL 6.9  6.8  7.2   Total Bilirubin 0.0 - 1.2 mg/dL 0.3  0.3  0.5   Alkaline Phos 44 - 121 IU/L 108  74  69   AST 0 - 40 IU/L 19  20  25    ALT 0 - 32 IU/L 12  18  17         Magnus Schuller, MD 09/24/2023, 3:20 PM  Cc: Janece Means, FNP

## 2023-09-24 NOTE — Addendum Note (Signed)
 Addended by: Linette Rias E on: 09/24/2023 04:13 PM   Modules accepted: Orders

## 2023-09-25 ENCOUNTER — Telehealth: Payer: Self-pay

## 2023-09-25 NOTE — Telephone Encounter (Signed)
 I called patient to schedule follow up concerning BP. Left message for patient to call back and schedule.

## 2023-09-27 ENCOUNTER — Ambulatory Visit: Payer: Self-pay

## 2023-09-27 NOTE — Telephone Encounter (Signed)
 2nd attempt, no answer. Left voicemail for patient to return call for nurse triage.    Patient states she saw Dr Venice Gillis and BP was high. They wanted her to follow up with her doctor. She has kept track of it at home and these are her last few readings. She thinks her BP was elevated due to seeing the doctor. Wanted to follow up with the nurse as Dr Venice Gillis Suggested. 6/16 PM 107/80 pulse 86, 6/17 AM 116/78 pulse 72, 6/17 PM 100/71 pulse 78, 6/18 AM 110/78 pulse 72, 6/18 PM 104/76 pulse 81, 6/19 AM 99/71 pulse 74.  Patient has upcoming colonoscopy.

## 2023-09-27 NOTE — Telephone Encounter (Signed)
 FYI Only or Action Required?: FYI only for provider.  Patient was last seen in primary care on 06/27/2023 by Janece Means, FNP. Called Nurse Triage reporting notify MD. Symptoms began today. Interventions attempted: Nothing. Symptoms are: stable.  Triage Disposition: Information or Advice Only Call  Patient/caregiver understands and will follow disposition?: Yes  Reason for Disposition  [1] Other NON-URGENT information for PCP AND [2] does not require PCP response  Answer Assessment - Initial Assessment Questions 1. REASON FOR CALL or QUESTION: What is your reason for calling today? or How can I best help you? or What question do you have that I can help answer?     Pt wanted to let MD Venice Gillis know that her recent BP while she has been home since her appointment on Monday have been WNL, she does not have any s/s and states she is feeling good.  2. CALLER: Document the source of call. (e.g., laboratory, patient).     Pt calling  Protocols used: PCP Call - No Triage-A-AH

## 2023-09-27 NOTE — Telephone Encounter (Signed)
 1st attempt, no answer. Left voicemail for patient to return call to nurse triage.     Patient states she saw Dr Venice Gillis and BP was high. They wanted her to follow up with her doctor. She has kept track of it at home and these are her last few readings. She thinks her BP was elevated due to seeing the doctor. Wanted to follow up with the nurse as Dr Venice Gillis Suggested. 6/16 PM 107/80 pulse 86, 6/17 AM 116/78 pulse 72, 6/17 PM 100/71 pulse 78, 6/18 AM 110/78 pulse 72, 6/18 PM 104/76 pulse 81, 6/19 AM 99/71 pulse 74.  Patient has upcoming colonoscopy.

## 2023-10-23 ENCOUNTER — Ambulatory Visit (AMBULATORY_SURGERY_CENTER): Admitting: Gastroenterology

## 2023-10-23 ENCOUNTER — Encounter: Payer: Self-pay | Admitting: Gastroenterology

## 2023-10-23 VITALS — BP 171/70 | HR 66 | Temp 97.7°F | Resp 15 | Ht 59.5 in | Wt 93.0 lb

## 2023-10-23 DIAGNOSIS — K5730 Diverticulosis of large intestine without perforation or abscess without bleeding: Secondary | ICD-10-CM

## 2023-10-23 DIAGNOSIS — F411 Generalized anxiety disorder: Secondary | ICD-10-CM | POA: Diagnosis not present

## 2023-10-23 DIAGNOSIS — K640 First degree hemorrhoids: Secondary | ICD-10-CM | POA: Diagnosis not present

## 2023-10-23 DIAGNOSIS — Z1211 Encounter for screening for malignant neoplasm of colon: Secondary | ICD-10-CM

## 2023-10-23 DIAGNOSIS — K219 Gastro-esophageal reflux disease without esophagitis: Secondary | ICD-10-CM | POA: Diagnosis not present

## 2023-10-23 DIAGNOSIS — Z83719 Family history of colon polyps, unspecified: Secondary | ICD-10-CM | POA: Diagnosis not present

## 2023-10-23 DIAGNOSIS — Q439 Congenital malformation of intestine, unspecified: Secondary | ICD-10-CM

## 2023-10-23 DIAGNOSIS — E039 Hypothyroidism, unspecified: Secondary | ICD-10-CM | POA: Diagnosis not present

## 2023-10-23 DIAGNOSIS — K5900 Constipation, unspecified: Secondary | ICD-10-CM

## 2023-10-23 DIAGNOSIS — J45909 Unspecified asthma, uncomplicated: Secondary | ICD-10-CM | POA: Diagnosis not present

## 2023-10-23 MED ORDER — SODIUM CHLORIDE 0.9 % IV SOLN: 500.0000 mL | INTRAVENOUS | Status: DC

## 2023-10-23 NOTE — Progress Notes (Signed)
 A/O x 3, gd SR's, VSS, report to RN

## 2023-10-23 NOTE — Op Note (Signed)
 [facility] Patient Name: Barbara Duncan Procedure Date: 10/23/2023 3:43 PM MRN: [id number] Endoscopist: Lynnie Bring , MD, [id number] Age: 73 Referring MD:  Date of Birth: 01-06-1951 Gender: Female Account #: [id number] Procedure:                Colonoscopy Indications:              Screening for colorectal malignant neoplasm Medicines:                Monitored Anesthesia Care Procedure:                Pre-Anesthesia Assessment:                           - Prior to the procedure, a History and Physical                            was performed, and patient medications and                            allergies were reviewed. The patient's tolerance of                            previous anesthesia was also reviewed. The risks                            and benefits of the procedure and the sedation                            options and risks were discussed with the patient.                            All questions were answered, and informed consent                            was obtained. Prior Anticoagulants: The patient has                            taken no anticoagulant or antiplatelet agents. ASA                            Grade Assessment: II - A patient with mild systemic                            disease. After reviewing the risks and benefits,                            the patient was deemed in satisfactory condition to                            undergo the procedure.                           After obtaining informed consent, the colonoscope  was passed under direct vision. Throughout the                            procedure, the patient's blood pressure, pulse, and                            oxygen saturations were monitored continuously. The                            PCF-HQ190L Colonoscope [id number] was introduced                            through the anus and advanced to the 2 cm into the                            ileum. The  colonoscopy was performed without                            difficulty. The patient tolerated the procedure                            well. The quality of the bowel preparation was                            good. The terminal ileum, ileocecal valve,                            appendiceal orifice, and rectum were photographed. Scope In: 3:54:13 PM Scope Out: 4:11:17 PM Scope Withdrawal Time: 0 hours 8 minutes 50 seconds  Total Procedure Duration: 0 hours 17 minutes 4 seconds  Findings:                 Rare small-mouthed diverticula were found in the                            sigmoid colon.                           Non-bleeding internal hemorrhoids were found during                            retroflexion. The hemorrhoids were small and Grade                            I (internal hemorrhoids that do not prolapse).                           The terminal ileum appeared normal.                           The exam was otherwise without abnormality on                            direct and retroflexion views. The colon was  somewhat torturous and difficult to examine.                           Retroflexion in the right colon was performed. Complications:            No immediate complications. Estimated Blood Loss:     Estimated blood loss: none. Impression:               - Minimal sigmoid diverticulosis.                           - Non-bleeding internal hemorrhoids.                           - The examined portion of the ileum was normal.                           - The examination was otherwise normal on direct                            and retroflexion views.                           - No specimens collected. Recommendation:           - Patient has a contact number available for                            emergencies. The signs and symptoms of potential                            delayed complications were discussed with the                            patient.  Return to normal activities tomorrow.                            Written discharge instructions were provided to the                            patient.                           - Resume previous diet.                           - Continue present medications.                           - Repeat colonoscopy is not recommended for                            screening purposes. Hence, repeat colonoscopy only                            with any new problems.                           -  The findings and recommendations were discussed                            with the patient's family. Lynnie Bring, MD 10/23/2023 4:16:27 PM This report has been signed electronically.

## 2023-10-23 NOTE — Progress Notes (Signed)
 Pt complained of nausea. Reported to MD and CRNA. Zofran  4mg  IV was administered via CRNA. Pt denies nausea at the present time at discharge.

## 2023-10-23 NOTE — Progress Notes (Signed)
 Chief Complaint: For colonoscopy  Referring Provider:  Sherre Clapper, MD      ASSESSMENT AND PLAN;   #1. FH polyps (at age >23)  #2. IBS with predom constipation  #3.  Asymptomatic GERD with HH.  Diet controlled.  Plan: -Colon with 2 day prep -Miralax 17g po every day -Avoids NSAIds -If no BM x 2 days, take 2 tabs of dulcolax.   Discussed risks & benefits of colonoscopy. Risks including rare perforation req laparotomy, bleeding after bx/polypectomy req blood transfusion, rarely missing neoplasms, risks of anesthesia/sedation, rare risk of damage to internal organs. Benefits outweigh the risks. Patient agrees to proceed. All the questions were answered. Pt consents to proceed.  HPI:    Barbara Duncan is a 73 y.o. female  Accompanied by her husband  History of Present Illness Barbara Duncan is a 73 year old female with constipation and diverticulosis who presents with alternating constipation and diarrhea.  She experiences alternating episodes of constipation and diarrhea, with constipation lasting a week or more followed by sudden onset diarrhea lasting a day or two. This pattern disrupts her daily schedule.  She has attempted to manage her symptoms with Metamucil and Dulcolax. Metamucil is ineffective, and Dulcolax provides only partial relief, as she does not feel completely emptied. She takes Dulcolax at night, but it does not consistently result in a bowel movement the following day.  About a year ago, she experienced significant abdominal pain, leading to emergency care at [facility]. She was found to have a large amount of stool in her colon and was treated with Miralax. A CT scan from May 2024 showed a moderate-sized hiatal hernia and extensive stool with mildly dilated loops of the colon.  Her past medical history includes a cholecystectomy. Her mother had polyps diagnosed in her sixties or seventies.  Socially, she prefers drinking sweet tea over water and notes  abdominal bloating, making her appear 'almost pregnant'.  No heartburn, nausea, or vomiting. She occasionally takes Tylenol for infrequent headaches.  Results RADIOLOGY CT scan: Moderate-sized hiatal hernia and extensive stool with mildly dilated loops of the colon (08/10/2022)  DIAGNOSTIC Colonoscopy([facility]): Small internal hemorrhoids, otherwise normal (12/25/2012)    Past Medical History:  Diagnosis Date   Acquired hypothyroidism    Adult BMI <19 kg/sq m 11/08/2021   Anemia    Aortic atherosclerosis (HCC)    Asthma    B12 deficiency 07/06/2015   Chronic idiopathic constipation 08/03/2022   Dyslipidemia    Encounter for immunization 03/10/2023   Essential hypertension 07/05/2015   GAD (generalized anxiety disorder) 03/10/2023   Gallstones 2009   Hypothyroidism 06/25/2019   Mixed hyperlipidemia 06/25/2019   Osteoporosis    Rash 09/25/2022   Stroke (HCC) 07/08/2015   Syncope    Vitamin B12 deficiency (non anemic) 06/17/2019    Past Surgical History:  Procedure Laterality Date   CARDIAC CATHETERIZATION     CHOLECYSTECTOMY     COLONOSCOPY  12/25/2012   Small internal hemorrhoids. Otherwise normal colonoscopy. The colon was highly redundant   ELBOW SURGERY Right 2008   Dr Rubin, tendon release   Lap Uterine Suspension  2002    Family History  Problem Relation Age of Onset   Hypertension Mother    Hyperlipidemia Mother    Colon polyps Mother    Diabetes Mother    Hypertension Father    Hyperlipidemia Father    Parkinson's disease Father    Dementia Sister    Alzheimer's disease Sister    Diabetes  Sister    Heart disease Maternal Grandmother    Colon cancer Neg Hx    Esophageal cancer Neg Hx    Rectal cancer Neg Hx    Stomach cancer Neg Hx     Social History   Tobacco Use   Smoking status: Never   Smokeless tobacco: Never  Vaping Use   Vaping status: Never Used  Substance Use Topics   Alcohol use: Never   Drug use: Never    Current Outpatient  Medications  Medication Sig Dispense Refill   aspirin  EC 81 MG tablet Take 1 tablet (81 mg total) by mouth daily. 90 tablet 3   levothyroxine  (SYNTHROID ) 25 MCG tablet TAKE 1 TABLET BY MOUTH ONCE DAILY. 90 tablet 2   pravastatin  (PRAVACHOL ) 20 MG tablet TAKE 1 TABLET BY MOUTH ONCE DAILY. 90 tablet 1   verapamil  (CALAN -SR) 180 MG CR tablet TAKE 1 TABLET BY MOUTH EVERY MORNING. 90 tablet 1   bisacodyl (DULCOLAX) 5 MG EC tablet Take 5 mg by mouth daily as needed for moderate constipation.     citalopram  (CELEXA ) 10 MG tablet Take 1 tablet (10 mg total) by mouth daily. 30 tablet 2   pantoprazole (PROTONIX) 40 MG tablet TAKE ONE TABLET BY MOUTH ONCE DAILY (Patient not taking: Reported on 10/23/2023) 90 tablet 2   Current Facility-Administered Medications  Medication Dose Route Frequency Provider Last Rate Last Admin   0.9 %  sodium chloride  infusion  500 mL Intravenous Continuous Charlanne Groom, MD       [START ON 12/28/2023] denosumab  (PROLIA ) injection 60 mg  60 mg Subcutaneous Q6 months Cox, Kirsten, MD        No Known Allergies  Review of Systems:  Constitutional: Denies fever, chills, diaphoresis, appetite change and fatigue.  HEENT: Denies photophobia, eye pain, redness, hearing loss, ear pain, congestion, sore throat, rhinorrhea, sneezing, mouth sores, neck pain, neck stiffness and tinnitus.   Respiratory: Denies SOB, DOE, cough, chest tightness,  and wheezing.   Cardiovascular: Denies chest pain, palpitations and leg swelling.  Genitourinary: Denies dysuria, urgency, frequency, hematuria, flank pain and difficulty urinating.  Musculoskeletal: Denies myalgias, back pain, joint swelling, arthralgias and gait problem.  Skin: No rash.  Neurological: Denies dizziness, seizures, syncope, weakness, light-headedness, numbness and headaches.  Hematological: Denies adenopathy. Easy bruising, personal or family bleeding history  Psychiatric/Behavioral: No anxiety or depression     Physical Exam:     BP (!) 176/80   Pulse 80   Temp 97.7 F (36.5 C) (Skin)   Resp 19   Ht 4' 11.5 (1.511 m)   Wt 93 lb (42.2 kg)   SpO2 (!) 77%   BMI 18.47 kg/m  Wt Readings from Last 3 Encounters:  10/23/23 93 lb (42.2 kg)  09/24/23 93 lb 8 oz (42.4 kg)  06/27/23 94 lb 3.2 oz (42.7 kg)   Constitutional:  Well-developed, in no acute distress. Psychiatric: Normal mood and affect. Behavior is normal. HEENT: Pupils normal.  Conjunctivae are normal. No scleral icterus. Neck supple.  Cardiovascular: Normal rate, regular rhythm. No edema Pulmonary/chest: Effort normal and breath sounds normal. No wheezing, rales or rhonchi. Abdominal: Soft, nondistended. Nontender. Bowel sounds active throughout. There are no masses palpable. No hepatomegaly. Rectal: Deferred Neurological: Alert and oriented to person place and time. Skin: Skin is warm and dry. No rashes noted.  Data Reviewed: I have personally reviewed following labs and imaging studies  CBC:    Latest Ref Rng & Units 06/12/2023    9:50 AM 11/14/2022  8:39 AM 05/15/2022   10:12 AM  CBC  WBC 3.4 - 10.8 x10E3/uL 6.3  8.3  6.4   Hemoglobin 11.1 - 15.9 g/dL 86.7  86.7  86.2   Hematocrit 34.0 - 46.6 % 40.3  39.1  40.7   Platelets 150 - 450 x10E3/uL 324  302  393     CMP:    Latest Ref Rng & Units 06/12/2023    9:50 AM 11/14/2022    8:39 AM 05/15/2022   10:12 AM  CMP  Glucose 70 - 99 mg/dL 84  79  89   BUN 8 - 27 mg/dL 11  13  11    Creatinine 0.57 - 1.00 mg/dL 9.02  9.03  8.95   Sodium 134 - 144 mmol/L 142  139  139   Potassium 3.5 - 5.2 mmol/L 5.4  5.3  5.3   Chloride 96 - 106 mmol/L 104  102  101   CO2 20 - 29 mmol/L 19  23  22    Calcium 8.7 - 10.3 mg/dL 89.9  9.8  89.8   Total Protein 6.0 - 8.5 g/dL 6.9  6.8  7.2   Total Bilirubin 0.0 - 1.2 mg/dL 0.3  0.3  0.5   Alkaline Phos 44 - 121 IU/L 108  74  69   AST 0 - 40 IU/L 19  20  25    ALT 0 - 32 IU/L 12  18  17         Anselm Bring, MD 10/23/2023, 3:49 PM  Cc: Sherre Clapper, MD

## 2023-10-23 NOTE — Progress Notes (Signed)
 Patient states there have been no changes to medical or surgical history since time of pre-visit.

## 2023-10-23 NOTE — Patient Instructions (Signed)
 YOU HAD AN ENDOSCOPIC PROCEDURE TODAY AT THE [facility]:   Refer to the procedure report that was given to you for any specific questions about what was found during the examination.  If the procedure report does not answer your questions, please call your gastroenterologist to clarify.  If you requested that your care partner not be given the details of your procedure findings, then the procedure report has been included in a sealed envelope for you to review at your convenience later.  YOU SHOULD EXPECT: Some feelings of bloating in the abdomen. Passage of more gas than usual.  Walking can help get rid of the air that was put into your GI tract during the procedure and reduce the bloating. If you had a lower endoscopy (such as a colonoscopy or flexible sigmoidoscopy) you may notice spotting of blood in your stool or on the toilet paper. If you underwent a bowel prep for your procedure, you may not have a normal bowel movement for a few days.  Please Note:  You might notice some irritation and congestion in your nose or some drainage.  This is from the oxygen used during your procedure.  There is no need for concern and it should clear up in a day or so.  SYMPTOMS TO REPORT IMMEDIATELY:  Following lower endoscopy (colonoscopy or flexible sigmoidoscopy):  Excessive amounts of blood in the stool  Significant tenderness or worsening of abdominal pains  Swelling of the abdomen that is new, acute  Fever of 100F or higher  For urgent or emergent issues, a gastroenterologist can be reached at any hour by calling [phone]. Do not use MyChart messaging for urgent concerns.    DIET:  We do recommend a small meal at first, but then you may proceed to your regular diet.  Drink plenty of fluids but you should avoid alcoholic beverages for 24 hours.  Resume previous diet.   MEDICATIONS: Continue present medications  Handouts provided on diverticulosis and hemorrhoids  Repeat  colonoscopy is not recommended for screening purposes. Hence, repeat colonoscopy only with any new problems.   ACTIVITY:  You should plan to take it easy for the rest of today and you should NOT DRIVE or use heavy machinery until tomorrow (because of the sedation medicines used during the test).    FOLLOW UP: Our staff will call the number listed on your records the next business day following your procedure.  We will call around 7:15- 8:00 am to check on you and address any questions or concerns that you may have regarding the information given to you following your procedure. If we do not reach you, we will leave a message.     If any biopsies were taken you will be contacted by phone or by letter within the next 1-3 weeks.  Please call us  at [phone] if you have not heard about the biopsies in 3 weeks.    SIGNATURES/CONFIDENTIALITY: You and/or your care partner have signed paperwork which will be entered into your electronic medical record.  These signatures attest to the fact that that the information above on your After Visit Summary has been reviewed and is understood.  Full responsibility of the confidentiality of this discharge information lies with you and/or your care-partner.

## 2023-10-24 ENCOUNTER — Telehealth: Payer: Self-pay | Admitting: *Deleted

## 2023-10-24 NOTE — Telephone Encounter (Signed)
  Follow up Call-     10/23/2023    2:55 PM  Call back number  Post procedure Call Back phone  # [phone]  Permission to leave phone message Yes     Patient questions:   Message left to call us  if necessary.

## 2023-11-15 ENCOUNTER — Other Ambulatory Visit: Payer: Self-pay | Admitting: Family Medicine

## 2023-11-22 DIAGNOSIS — H33312 Horseshoe tear of retina without detachment, left eye: Secondary | ICD-10-CM | POA: Diagnosis not present

## 2023-11-22 DIAGNOSIS — Q141 Congenital malformation of retina: Secondary | ICD-10-CM | POA: Diagnosis not present

## 2023-11-29 DIAGNOSIS — H33312 Horseshoe tear of retina without detachment, left eye: Secondary | ICD-10-CM | POA: Diagnosis not present

## 2023-12-03 ENCOUNTER — Other Ambulatory Visit: Payer: Self-pay | Admitting: Family Medicine

## 2023-12-12 NOTE — Progress Notes (Signed)
 Subjective:  Patient ID: Barbara Duncan, female    DOB: 17-Oct-1950  Age: 73 y.o. MRN: [id number]  Chief Complaint  Patient presents with   Medical Management of Chronic Issues    HPI: Discussed the use of AI scribe software for clinical note transcription with the patient, who gave verbal consent to proceed.  History of Present Illness Barbara Duncan is a 73 year old female who presents for a routine follow-up visit.  Colorectal cancer screening and gastrointestinal function - Colonoscopy performed on July 16th with no follow-up required unless issues arise - Regular bowel movements since colonoscopy - Intestinal anatomy described as unusually positioned due to small body size - No bowel problems reported  Breast cancer screening - Mammogram scheduled for today already ordered with [facility]  Ocular symptoms and retinal tears - Retinal tears present in both eyes, left eye more severely affected - Left eye underwent repair procedure, which was painful and required multiple applications of numbing drops - Right eye scheduled for repair - Suspected etiology includes trauma from cows and a fence or age-related degeneration  Blood pressure management - Blood pressure readings range from 105/84 (lowest) to 139/81 (highest) - Currently taking verapamil  CR 180 mg daily for blood pressure control  Thyroid  function - On levothyroxine  25 mcg once dailyfor thyroid  management  Hyperlipidemia - On pravastatin  20 mg daily for cholesterol management  Gastroesophageal reflux disease - Uses pantoprazole 40 mg once daily as needed for acid reflux  Antiplatelet therapy - Takes daily baby aspirin   Bowel regularity - Uses stool softener  Mood and anxiety symptoms - Not currently on medication for depression or anxiety  General constitutional and systemic symptoms - No recent falls - No fevers, chills, sweats, earaches, sore throat, stuffy nose, chest pain, breathing problems, or  bladder issues       06/27/2023    2:41 PM 04/16/2023    9:11 AM 03/27/2023    9:37 AM 11/14/2022    8:21 AM 08/17/2022    1:44 PM  Depression screen PHQ 2/9  Decreased Interest 0 0 0 0 0  Down, Depressed, Hopeless 0 0 0 0 0  PHQ - 2 Score 0 0 0 0 0  Altered sleeping  0 0 0 0  Tired, decreased energy  1 0 0 0  Change in appetite  0 0 0 0  Feeling bad or failure about yourself   0 0 0 0  Trouble concentrating  0 0 0 0  Moving slowly or fidgety/restless  0 0 0 0  Suicidal thoughts  0 0 0 0  PHQ-9 Score  1 0 0 0  Difficult doing work/chores  Not difficult at all Not difficult at all Not difficult at all Not difficult at all        06/27/2023    2:39 PM  Fall Risk   Falls in the past year? 0  Number falls in past yr: 0  Injury with Fall? 0  Risk for fall due to : No Fall Risks  Follow up Falls evaluation completed    Patient Care Team: Sherre Clapper, MD as PCP - General (Family Medicine) Lenon Dames as Referring Physician (Obstetrics and Gynecology)   Review of Systems  Constitutional:  Negative for chills, fatigue and fever.  HENT:  Negative for congestion, ear pain and sore throat.   Respiratory:  Negative for cough and shortness of breath.   Cardiovascular:  Negative for chest pain and palpitations.  Gastrointestinal:  Negative for abdominal pain,  constipation, diarrhea, nausea and vomiting.  Endocrine: Negative for polydipsia, polyphagia and polyuria.  Genitourinary:  Negative for difficulty urinating and dysuria.  Musculoskeletal:  Negative for arthralgias, back pain and myalgias.  Skin:  Negative for rash.  Neurological:  Negative for headaches.  Psychiatric/Behavioral:  Negative for dysphoric mood. The patient is not nervous/anxious.     Current Outpatient Medications on File Prior to Visit  Medication Sig Dispense Refill   aspirin  EC 81 MG tablet Take 1 tablet (81 mg total) by mouth daily. 90 tablet 3   bisacodyl (DULCOLAX) 5 MG EC tablet Take 5 mg by mouth daily  as needed for moderate constipation.     levothyroxine  (SYNTHROID ) 25 MCG tablet TAKE 1 TABLET BY MOUTH ONCE DAILY. 90 tablet 2   pantoprazole (PROTONIX) 40 MG tablet TAKE ONE TABLET BY MOUTH ONCE DAILY 90 tablet 2   pravastatin  (PRAVACHOL ) 20 MG tablet TAKE 1 TABLET BY MOUTH ONCE DAILY. 90 tablet 1   verapamil  (CALAN -SR) 180 MG CR tablet TAKE 1 TABLET BY MOUTH EVERY MORNING. 90 tablet 1   Current Facility-Administered Medications on File Prior to Visit  Medication Dose Route Frequency Provider Last Rate Last Admin   [START ON 12/28/2023] denosumab  (PROLIA ) injection 60 mg  60 mg Subcutaneous Q6 months Garfield Coiner, MD       Past Medical History:  Diagnosis Date   Acquired hypothyroidism    Adult BMI <19 kg/sq m 11/08/2021   Anemia    Aortic atherosclerosis (HCC)    Asthma    B12 deficiency 07/06/2015   Chronic idiopathic constipation 08/03/2022   Dyslipidemia    Encounter for immunization 03/10/2023   Essential hypertension 07/05/2015   GAD (generalized anxiety disorder) 03/10/2023   Gallstones 2009   Hypothyroidism 06/25/2019   Mixed hyperlipidemia 06/25/2019   Osteoporosis    Rash 09/25/2022   Stroke (HCC) 07/08/2015   Syncope    Vitamin B12 deficiency (non anemic) 06/17/2019   Past Surgical History:  Procedure Laterality Date   CARDIAC CATHETERIZATION     CHOLECYSTECTOMY     COLONOSCOPY  12/25/2012   Small internal hemorrhoids. Otherwise normal colonoscopy. The colon was highly redundant   ELBOW SURGERY Right 2008   Dr Rubin, tendon release   Lap Uterine Suspension  2002    Family History  Problem Relation Age of Onset   Hypertension Mother    Hyperlipidemia Mother    Colon polyps Mother    Diabetes Mother    Hypertension Father    Hyperlipidemia Father    Parkinson's disease Father    Dementia Sister    Alzheimer's disease Sister    Diabetes Sister    Heart disease Maternal Grandmother    Colon cancer Neg Hx    Esophageal cancer Neg Hx    Rectal cancer  Neg Hx    Stomach cancer Neg Hx    Social History   Socioeconomic History   Marital status: Married    Spouse name: Not on file   Number of children: 0   Years of education: Not on file   Highest education level: Not on file  Occupational History   Occupation: retired  Tobacco Use   Smoking status: Never   Smokeless tobacco: Never  Vaping Use   Vaping status: Never Used  Substance and Sexual Activity   Alcohol use: Never   Drug use: Never   Sexual activity: Not Currently  Other Topics Concern   Not on file  Social History Narrative   Not on file  Social Drivers of Corporate investment banker Strain: Low Risk  (06/12/2023)   Overall Financial Resource Strain (CARDIA)    Difficulty of Paying Living Expenses: Not hard at all  Food Insecurity: No Food Insecurity (06/12/2023)   Hunger Vital Sign    Worried About Running Out of Food in the Last Year: Never true    Ran Out of Food in the Last Year: Never true  Transportation Needs: No Transportation Needs (06/12/2023)   PRAPARE - Administrator, Civil Service (Medical): No    Lack of Transportation (Non-Medical): No  Physical Activity: Inactive (06/12/2023)   Exercise Vital Sign    Days of Exercise per Week: 2 days    Minutes of Exercise per Session: 0 min  Stress: No Stress Concern Present (06/12/2023)   [employer] of Occupational Health - Occupational Stress Questionnaire    Feeling of Stress : Not at all  Social Connections: Socially Integrated (06/12/2023)   Social Connection and Isolation Panel    Frequency of Communication with Friends and Family: More than three times a week    Frequency of Social Gatherings with Friends and Family: Twice a week    Attends Religious Services: More than 4 times per year    Active Member of [employer] or Organizations: Yes    Attends Engineer, structural: More than 4 times per year    Marital Status: Married    Objective:  BP 126/80   Pulse 73   Temp (!) 97.1 F  (36.2 C)   Resp 16   Ht 4' 11.5 (1.511 m)   Wt 98 lb (44.5 kg)   SpO2 96%   BMI 19.46 kg/m      12/13/2023    9:20 AM 10/23/2023    4:35 PM 10/23/2023    4:25 PM  BP/Weight  Systolic BP 126 171 124  Diastolic BP 80 70 63  Wt. (Lbs) 98    BMI 19.46 kg/m2      Physical Exam      Lab Results  Component Value Date   WBC 6.3 06/12/2023   HGB 13.2 06/12/2023   HCT 40.3 06/12/2023   PLT 324 06/12/2023   GLUCOSE 84 06/12/2023   CHOL 177 06/12/2023   TRIG 76 06/12/2023   HDL 79 06/12/2023   LDLCALC 84 06/12/2023   ALT 12 06/12/2023   AST 19 06/12/2023   NA 142 06/12/2023   K 5.4 (H) 06/12/2023   CL 104 06/12/2023   CREATININE 0.97 06/12/2023   BUN 11 06/12/2023   CO2 19 (L) 06/12/2023   TSH 3.540 06/12/2023      Assessment & Plan:   1. Essential hypertension (Primary)  2. Other specified hypothyroidism  3. Mixed hyperlipidemia  4. Retinal tear, left  5. Visit for screening mammogram  6. Encounter for immunization - Flu vaccine HIGH DOSE PF(Fluzone Trivalent)  7. Retinal detachment of left eye with single retinal tear  8. Other osteoporosis without current pathological fracture  9. Gastroesophageal reflux disease without esophagitis    Assessment & Plan Essential hypertension Blood pressure slightly elevated, attributed to recent retinal tear surgery. Well-controlled with verapamil . - Continue verapamil  for blood pressure management.  Other specified hypothyroidism Hypothyroidism managed with levothyroxine . Recent thyroid  levels normal. - Continue levothyroxine  as prescribed.  Mixed hyperlipidemia Cholesterol well-controlled with pravastatin . - Continue pravastatin  for cholesterol management.  Retinal tear, left eye, status post repair and retinal tear, right eye Left eye tear repaired. Right eye requires similar treatment. Cause  of tears uncertain.  Osteoporosis, receiving Prolia  Scheduled for Prolia  administration for osteoporosis  management. - Administer Prolia  as scheduled.  Gastroesophageal reflux disease (GERD) GERD managed with pantoprazole as needed. - Continue pantoprazole as needed for GERD symptoms.  Keep mammogram appointment.  Labs reviewed from March 2025.  No orders of the defined types were placed in this encounter.   Orders Placed This Encounter  Procedures   Flu vaccine HIGH DOSE PF(Fluzone Trivalent)     Follow-up: Return in about 7 months (around 06/30/2024) for awv, chronic follow up, 40 minutes please.  I,Marla I Leal-Borjas,acting as a scribe for Abigail Free, MD.,have documented all relevant documentation on the behalf of Abigail Free, MD,as directed by  Abigail Free, MD while in the presence of Abigail Free, MD.    An After Visit Summary was printed and given to the patient.  Abigail Free, MD [facility] [phone]

## 2023-12-13 ENCOUNTER — Ambulatory Visit: Admitting: Family Medicine

## 2023-12-13 ENCOUNTER — Encounter: Payer: Self-pay | Admitting: Family Medicine

## 2023-12-13 VITALS — BP 126/80 | HR 73 | Temp 97.1°F | Resp 16 | Ht 59.5 in | Wt 98.0 lb

## 2023-12-13 DIAGNOSIS — H33312 Horseshoe tear of retina without detachment, left eye: Secondary | ICD-10-CM

## 2023-12-13 DIAGNOSIS — H33012 Retinal detachment with single break, left eye: Secondary | ICD-10-CM | POA: Diagnosis not present

## 2023-12-13 DIAGNOSIS — M818 Other osteoporosis without current pathological fracture: Secondary | ICD-10-CM | POA: Diagnosis not present

## 2023-12-13 DIAGNOSIS — Z1231 Encounter for screening mammogram for malignant neoplasm of breast: Secondary | ICD-10-CM

## 2023-12-13 DIAGNOSIS — Z23 Encounter for immunization: Secondary | ICD-10-CM | POA: Diagnosis not present

## 2023-12-13 DIAGNOSIS — I10 Essential (primary) hypertension: Secondary | ICD-10-CM | POA: Diagnosis not present

## 2023-12-13 DIAGNOSIS — E038 Other specified hypothyroidism: Secondary | ICD-10-CM | POA: Diagnosis not present

## 2023-12-13 DIAGNOSIS — E782 Mixed hyperlipidemia: Secondary | ICD-10-CM | POA: Diagnosis not present

## 2023-12-13 DIAGNOSIS — K219 Gastro-esophageal reflux disease without esophagitis: Secondary | ICD-10-CM

## 2023-12-13 NOTE — Assessment & Plan Note (Signed)
 Previously well controlled Continue Synthroid at current dose

## 2023-12-13 NOTE — Assessment & Plan Note (Signed)
 Well controlled on Pravastatin.  -Continue Pravastatin.  Recommend continue to work on eating healthy diet and exercise.

## 2023-12-13 NOTE — Assessment & Plan Note (Signed)
 Scheduled for Prolia  administration for osteoporosis management. - Administer Prolia  as scheduled.

## 2023-12-13 NOTE — Assessment & Plan Note (Signed)
 GERD managed with pantoprazole as needed. - Continue pantoprazole as needed for GERD symptoms.

## 2023-12-13 NOTE — Assessment & Plan Note (Addendum)
 Bp at goal.  Continue verapamil  180 mg daily.

## 2023-12-13 NOTE — Assessment & Plan Note (Signed)
 Left eye tear repaired. Right eye requires similar treatment. Cause of tears uncertain.

## 2023-12-13 NOTE — Patient Instructions (Addendum)
  VISIT SUMMARY: Today, we reviewed your recent colonoscopy results, discussed your upcoming mammogram, and addressed your eye health, blood pressure, thyroid  function, cholesterol levels, osteoporosis treatment, and acid reflux management.  YOUR PLAN: ESSENTIAL HYPERTENSION: Your blood pressure is well-controlled with your current medication. -Continue taking verapamil  as prescribed.  OTHER SPECIFIED HYPOTHYROIDISM: Your thyroid  function is stable with your current medication. -Continue taking levothyroxine  as prescribed.  MIXED HYPERLIPIDEMIA: Your cholesterol levels are well-controlled with your current medication. -Continue taking pravastatin  as prescribed.  OSTEOPOROSIS: You are scheduled to receive Prolia  for osteoporosis management in the next couple of weeks.   GASTROESOPHAGEAL REFLUX DISEASE (GERD): Your acid reflux is managed with medication as needed. -Continue taking pantoprazole as needed for acid reflux symptoms.                      Contains text generated by [employer]bridge.                                 Contains text generated by Abridge.

## 2023-12-18 DIAGNOSIS — Z1231 Encounter for screening mammogram for malignant neoplasm of breast: Secondary | ICD-10-CM | POA: Diagnosis not present

## 2023-12-18 LAB — HM MAMMOGRAPHY

## 2023-12-19 ENCOUNTER — Encounter: Payer: Self-pay | Admitting: Family Medicine

## 2023-12-19 ENCOUNTER — Ambulatory Visit: Payer: Self-pay | Admitting: Family Medicine

## 2023-12-27 DIAGNOSIS — H33311 Horseshoe tear of retina without detachment, right eye: Secondary | ICD-10-CM | POA: Diagnosis not present

## 2023-12-31 ENCOUNTER — Ambulatory Visit (INDEPENDENT_AMBULATORY_CARE_PROVIDER_SITE_OTHER)

## 2023-12-31 DIAGNOSIS — M818 Other osteoporosis without current pathological fracture: Secondary | ICD-10-CM

## 2023-12-31 MED ORDER — DENOSUMAB 60 MG/ML ~~LOC~~ SOSY: 60.0000 mg | PREFILLED_SYRINGE | Freq: Once | SUBCUTANEOUS | Status: AC

## 2023-12-31 NOTE — Progress Notes (Signed)
   Patient: Barbara Duncan  DOB: [birth year]  MRN: [id number]    Visit Date: 12/31/2023    Ticia Virgo presents today for her six month Prolia  injection.  1 x 60 mg Single-Dose Prefilled Syringe was given SQ in the left arm.  Patient tolerated the injection well and has no questions.  The next Prolia  injection will be due in six months.      Iyan Flett A [facility], CMA

## 2024-02-07 DIAGNOSIS — H33311 Horseshoe tear of retina without detachment, right eye: Secondary | ICD-10-CM | POA: Diagnosis not present

## 2024-04-09 ENCOUNTER — Encounter: Payer: Self-pay | Admitting: Family Medicine

## 2024-04-25 ENCOUNTER — Telehealth: Payer: Self-pay

## 2024-04-25 DIAGNOSIS — M818 Other osteoporosis without current pathological fracture: Secondary | ICD-10-CM

## 2024-04-25 NOTE — Telephone Encounter (Signed)
 Patient was in the office with her husband today and notified nursing staff that she would like to stop her Prolia . Her last injection was 12/31/23. Patient completed Evenity  and started Prolia  in March of 2025. Patient was on Fosamax  for more than 5 years.

## 2024-05-02 NOTE — Telephone Encounter (Signed)
 Called patient and she stated that there is no reason she stop prolia , she didn't see a difference it was making. I explain to her how important it is for her to do the injection and if she was having any problems with cost or side affect, the provider could try another medication. She stated that se will go back to taking prolia  denied any side affects.

## 2024-05-07 NOTE — Telephone Encounter (Signed)
 Spoke with patient, verbalized understanding and had no questions at this time. She states she is agreeable to dexa scan at [facility].  Order for dexa scan is ordered at this time.

## 2024-07-02 ENCOUNTER — Ambulatory Visit: Admitting: Family Medicine
